# Patient Record
Sex: Female | Born: 1990 | Race: White | Hispanic: No | Marital: Married | Smoking: Never smoker
Health system: Southern US, Community
[De-identification: ages and names within clinical notes are randomized; demographics above are authoritative.]

## PROBLEM LIST (undated history)

## (undated) ENCOUNTER — Inpatient Hospital Stay (HOSPITAL_COMMUNITY): Payer: Self-pay

## (undated) ENCOUNTER — Inpatient Hospital Stay (HOSPITAL_COMMUNITY): Payer: 59

## (undated) DIAGNOSIS — G43909 Migraine, unspecified, not intractable, without status migrainosus: Secondary | ICD-10-CM

## (undated) HISTORY — PX: NO PAST SURGERIES: SHX2092

## (undated) HISTORY — PX: APPENDECTOMY: SHX54

---
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
## 2014-05-13 ENCOUNTER — Encounter (HOSPITAL_COMMUNITY): Payer: Self-pay | Admitting: Emergency Medicine

## 2014-05-13 ENCOUNTER — Emergency Department (HOSPITAL_COMMUNITY): Payer: Self-pay

## 2014-05-13 ENCOUNTER — Emergency Department (HOSPITAL_COMMUNITY)
Admission: EM | Admit: 2014-05-13 | Discharge: 2014-05-13 | Disposition: A | Discharge status: Home / Self Care | Payer: Self-pay | Attending: Emergency Medicine | Admitting: Emergency Medicine

## 2014-05-13 DIAGNOSIS — Z3202 Encounter for pregnancy test, result negative: Secondary | ICD-10-CM | POA: Insufficient documentation

## 2014-05-13 DIAGNOSIS — S0990XA Unspecified injury of head, initial encounter: Secondary | ICD-10-CM | POA: Insufficient documentation

## 2014-05-13 DIAGNOSIS — IMO0002 Reserved for concepts with insufficient information to code with codable children: Secondary | ICD-10-CM | POA: Insufficient documentation

## 2014-05-13 DIAGNOSIS — Y929 Unspecified place or not applicable: Secondary | ICD-10-CM | POA: Insufficient documentation

## 2014-05-13 DIAGNOSIS — Y9389 Activity, other specified: Secondary | ICD-10-CM | POA: Insufficient documentation

## 2014-05-13 DIAGNOSIS — F0781 Postconcussional syndrome: Secondary | ICD-10-CM | POA: Insufficient documentation

## 2014-05-13 LAB — CBC WITH DIFFERENTIAL/PLATELET
Basophils Absolute: 0 10*3/uL (ref 0.0–0.1)
Basophils Relative: 0 % (ref 0–1)
Eosinophils Absolute: 0.1 10*3/uL (ref 0.0–0.7)
Eosinophils Relative: 1 % (ref 0–5)
HCT: 42.4 % (ref 36.0–46.0)
Hemoglobin: 14.7 g/dL (ref 12.0–15.0)
Lymphocytes Relative: 23 % (ref 12–46)
Lymphs Abs: 1.7 10*3/uL (ref 0.7–4.0)
MCH: 32 pg (ref 26.0–34.0)
MCHC: 34.7 g/dL (ref 30.0–36.0)
MCV: 92.4 fL (ref 78.0–100.0)
Monocytes Absolute: 0.6 10*3/uL (ref 0.1–1.0)
Monocytes Relative: 8 % (ref 3–12)
Neutro Abs: 5 10*3/uL (ref 1.7–7.7)
Neutrophils Relative %: 68 % (ref 43–77)
Platelets: 218 10*3/uL (ref 150–400)
RBC: 4.59 MIL/uL (ref 3.87–5.11)
RDW: 13.3 % (ref 11.5–15.5)
WBC: 7.3 10*3/uL (ref 4.0–10.5)

## 2014-05-13 LAB — BASIC METABOLIC PANEL
Anion gap: 15 (ref 5–15)
BUN: 19 mg/dL (ref 6–23)
CO2: 24 mEq/L (ref 19–32)
Calcium: 8.9 mg/dL (ref 8.4–10.5)
Chloride: 99 mEq/L (ref 96–112)
Creatinine, Ser: 0.78 mg/dL (ref 0.50–1.10)
GFR calc Af Amer: >90 mL/min (ref >90)
GFR calc non Af Amer: >90 mL/min (ref >90)
Glucose, Bld: 98 mg/dL (ref 70–99)
Potassium: 4.2 mEq/L (ref 3.7–5.3)
Sodium: 138 mEq/L (ref 137–147)

## 2014-05-13 MED ORDER — DIPHENHYDRAMINE HCL 50 MG/ML IJ SOLN
12.5000 mg | Freq: Once | INTRAMUSCULAR | Status: AC
  Administered 2014-05-13: 12.5 mg via INTRAVENOUS
  Filled 2014-05-13: qty 1

## 2014-05-13 MED ORDER — HYDROCODONE-ACETAMINOPHEN 5-325 MG PO TABS: ORAL_TABLET | ORAL | Status: DC

## 2014-05-13 MED ORDER — METOCLOPRAMIDE HCL 5 MG/ML IJ SOLN
10.0000 mg | Freq: Once | INTRAMUSCULAR | Status: AC
  Administered 2014-05-13: 10 mg via INTRAVENOUS
  Filled 2014-05-13: qty 2

## 2014-05-13 MED ORDER — SODIUM CHLORIDE 0.9 % IV SOLN
Freq: Once | INTRAVENOUS | Status: AC
Start: 2014-05-13 — End: 2014-05-13
  Administered 2014-05-13: 19:00:00 via INTRAVENOUS
  Filled 2014-05-13: qty 1000

## 2014-05-13 MED ORDER — KETOROLAC TROMETHAMINE 30 MG/ML IJ SOLN
30.0000 mg | Freq: Once | INTRAMUSCULAR | Status: AC
  Administered 2014-05-13: 30 mg via INTRAVENOUS
  Filled 2014-05-13: qty 1

## 2014-05-13 NOTE — ED Provider Notes (Signed)
CSN: [id number]     Arrival date & time 05/13/14  1730 History   First MD Initiated Contact with Patient 05/13/14 1801     Chief Complaint  Patient presents with  . Migraine     (Consider location/radiation/quality/duration/timing/severity/associated sxs/prior Treatment) HPI   Rebecca Strickland is a 23 y.o. female who presents to the Emergency Department complaining of right sided headache for two days.  She states that she was playing with her dog who accidentally "head butted" her behind the right ear.  She denies LOC.  She reports headache since then, but several hours prior to ED arrival, she had a gradual  worsening pain, photophobia, dizziness, brief visional loss, and vomited x 1 episode.  She states that she took an OTC migraine medication and the symptoms improved although did not completely resolve.  She states that she occasionally has headaches, but not symptoms like she experienced earlier today.  She denies symptoms at present other than nausea and right sided headache.  She denies fever, recent illness, neck pain or stiffness.     History reviewed. No pertinent past medical history. History reviewed. No pertinent past surgical history. History reviewed. No pertinent family history. History  Substance Use Topics  . Smoking status: Not on file  . Smokeless tobacco: Not on file  . Alcohol Use: Not on file   OB History   Grav Para Term Preterm Abortions TAB SAB Ect Mult Living                 Review of Systems  Constitutional: Negative for fever, activity change and appetite change.  HENT: Negative for facial swelling and trouble swallowing.   Eyes: Positive for photophobia. Negative for pain and visual disturbance.  Respiratory: Negative for shortness of breath.   Cardiovascular: Negative for chest pain.  Gastrointestinal: Negative for nausea and vomiting.  Musculoskeletal: Negative for neck pain and neck stiffness.  Skin: Negative for rash and wound.   Neurological: Positive for headaches. Negative for dizziness, facial asymmetry, speech difficulty, weakness and numbness.  Psychiatric/Behavioral: Negative for confusion and decreased concentration.  All other systems reviewed and are negative.     Allergies  Review of patient's allergies indicates no known allergies.  Home Medications   Prior to Admission medications   Not on File   BP 135/75  Pulse 83  Temp(Src) 99.1 F (37.3 C) (Oral)  Resp 18  Ht  (1.6 m)  Wt 175 lb (79.379 kg)  BMI 31.01 kg/m2  SpO2 100%  LMP 04/23/2014 Physical Exam  Nursing note and vitals reviewed. Constitutional: She is oriented to person, place, and time. She appears well-developed and well-nourished. No distress.  HENT:  Head: Normocephalic and atraumatic.  Mouth/Throat: Oropharynx is clear and moist.  Eyes: EOM are normal. Pupils are equal, round, and reactive to light.  Neck: Normal range of motion and phonation normal. Neck supple. No spinous process tenderness and no muscular tenderness present. No rigidity. No Brudzinski's sign and no Kernig's sign noted.  Cardiovascular: Normal rate, regular rhythm, normal heart sounds and intact distal pulses.   No murmur heard. Pulmonary/Chest: Effort normal and breath sounds normal. No respiratory distress. She exhibits no tenderness.  Musculoskeletal: Normal range of motion.  Neurological: She is alert and oriented to person, place, and time. She has normal strength. No cranial nerve deficit or sensory deficit. She exhibits normal muscle tone. Coordination and gait normal. GCS eye subscore is 4. GCS verbal subscore is 5. GCS motor subscore is 6.  Reflex Scores:      Tricep reflexes are 2+ on the right side and 2+ on the left side.      Bicep reflexes are 2+ on the right side and 2+ on the left side.      Patellar reflexes are 2+ on the right side and 2+ on the left side.      Achilles reflexes are 2+ on the right side and 2+ on the left  side. Skin: Skin is warm and dry.  Psychiatric: She has a normal mood and affect.    ED Course  Procedures (including critical care time) Labs Review Labs Reviewed  CBC WITH DIFFERENTIAL  BASIC METABOLIC PANEL  POC URINE PREG, ED    Imaging Review Ct Head Wo Contrast  05/13/2014   CLINICAL DATA:  Headache since an injury 2 days ago.  EXAM: CT HEAD WITHOUT CONTRAST  TECHNIQUE: Contiguous axial images were obtained from the base of the skull through the vertex without intravenous contrast.  COMPARISON:  None.  FINDINGS: Normal appearing cerebral hemispheres and posterior fossa structures. Normal size and position of the ventricles. No skull fracture, intracranial hemorrhage or paranasal sinus air-fluid levels.  IMPRESSION: Normal examination.   Electronically Signed   By: Gordan Payment M.D.   On: 05/13/2014 19:33     EKG Interpretation None      POCT pregnancy was reported to me by the nursing staff as negative, could not get result to cross into EPIC  MDM   Final diagnoses:  Post concussion syndrome   Patient is well appearing, VSS.  No meningeal signs.  Clinical suspicion for intracranial injury is low, but given history, I will order labs and CT head.     1730  Patient is feeling better at this time. Headache has completely resolved. She is currently asymptomatic and wanting to go home.  I have advised her of possible post-concussive syndrome and to arrange PMD f/u or to return here if sx's worsen .  Pt verbalized understanding and agrees to plan.  She is ambulatory and stable for d/c.    Keagon Glascoe L. Verdie Barrows, PA-C 05/15/14 0045

## 2014-05-13 NOTE — ED Notes (Addendum)
Pt states headache starting Sunday that wouldn't go away and lost vision today at 1:30 and was light sensitive.  Pt vomited and was dizzy, vision was blurry. When vision came back, h/a was very severe for about 3-5 minutes. Pt denies slurred speech, but noted she stopped in the middle of a sentence.

## 2014-05-13 NOTE — Discharge Instructions (Signed)
Post-Concussion Syndrome °Post-concussion syndrome means you have problems after a head injury. The problems can last for weeks or months. The problems usually go away on their own over time. °HOME CARE  °· Only take medicines as told by your doctor. Do not take aspirin. °· Sleep with your head raised (elevated) to help with headaches. °· Avoid activities that can cause another head injury.  Do not play contact sports like football, hockey, soccer or basketball. Do not do other risky activities like downhill skiing or martial arts, or ride horses until your doctor says it is OK. °· Keep all doctor visits as told. °GET HELP RIGHT AWAY IF: °· You feel confused or very sleepy. °· You cannot wake the injured person. °· You feel sick to your stomach (nauseous) or keep throwing up (vomiting). °· You feel like you are moving when you are not (vertigo). °· You notice the injured person's eyes moving back and forth very fast. °· You start shaking (convulsing) or pass out (faint). °· You have very bad headaches that do not get better with medicine. °· You cannot use your arms or legs normally. °· The black centers of your eyes (pupils) change size. °· You have clear or bloody fluid coming from your nose or ears. °· Your problems get worse, not better. °MAKE SURE YOU: °· Understand these instructions. °· Will watch your condition. °· Will get help right away if you are not doing well or get worse. °Document Released: 10/13/2004 Document Revised: 06/26/2013 Document Reviewed: 12/11/2013 °Exit[employer]® Patient Information ©2015 [employer]. This information is not intended to replace advice given to you by your health care provider. Make sure you discuss any questions you have with your health care provider. ° °

## 2014-05-16 NOTE — ED Provider Notes (Signed)
Medical screening examination/treatment/procedure(s) were performed by non-physician practitioner and as supervising physician I was immediately available for consultation/collaboration.   EKG Interpretation None        Renald Haithcock L Avila Albritton, MD 05/16/14 1506 

## 2014-07-07 ENCOUNTER — Encounter (HOSPITAL_COMMUNITY): Payer: Self-pay | Admitting: Emergency Medicine

## 2014-07-07 ENCOUNTER — Emergency Department (INDEPENDENT_AMBULATORY_CARE_PROVIDER_SITE_OTHER)
Admission: EM | Admit: 2014-07-07 | Discharge: 2014-07-07 | Disposition: A | Discharge status: Home / Self Care | Payer: Self-pay | Source: Home / Self Care | Attending: Family Medicine | Admitting: Family Medicine

## 2014-07-07 DIAGNOSIS — J029 Acute pharyngitis, unspecified: Secondary | ICD-10-CM

## 2014-07-07 DIAGNOSIS — J069 Acute upper respiratory infection, unspecified: Secondary | ICD-10-CM

## 2014-07-07 HISTORY — DX: Migraine, unspecified, not intractable, without status migrainosus: G43.909

## 2014-07-07 LAB — POCT RAPID STREP A: Streptococcus, Group A Screen (Direct): NEGATIVE

## 2014-07-07 NOTE — Discharge Instructions (Signed)
Your rapid strep test was negative. Your specimen will be held for a 3 day culture and if results indicate the need for additional treatment you will be notified by phone. Please review info below for additional symptomatic care at home. Please use tylenol or ibuprofen as directed on packaging for pain.   Pharyngitis Pharyngitis is redness, pain, and swelling (inflammation) of your pharynx.  CAUSES  Pharyngitis is usually caused by infection. Most of the time, these infections are from viruses (viral) and are part of a cold. However, sometimes pharyngitis is caused by bacteria (bacterial). Pharyngitis can also be caused by allergies. Viral pharyngitis may be spread from person to person by coughing, sneezing, and personal items or utensils (cups, forks, spoons, toothbrushes). Bacterial pharyngitis may be spread from person to person by more intimate contact, such as kissing.  SIGNS AND SYMPTOMS  Symptoms of pharyngitis include:   Sore throat.   Tiredness (fatigue).   Low-grade fever.   Headache.  Joint pain and muscle aches.  Skin rashes.  Swollen lymph nodes.  Plaque-like film on throat or tonsils (often seen with bacterial pharyngitis). DIAGNOSIS  Your health care provider will ask you questions about your illness and your symptoms. Your medical history, along with a physical exam, is often all that is needed to diagnose pharyngitis. Sometimes, a rapid strep test is done. Other lab tests may also be done, depending on the suspected cause.  TREATMENT  Viral pharyngitis will usually get better in 3-4 days without the use of medicine. Bacterial pharyngitis is treated with medicines that kill germs (antibiotics).  HOME CARE INSTRUCTIONS   Drink enough water and fluids to keep your urine clear or pale yellow.   Only take over-the-counter or prescription medicines as directed by your health care provider:   If you are prescribed antibiotics, make sure you finish them even if you  start to feel better.   Do not take aspirin.   Get lots of rest.   Gargle with 8 oz of salt water ( tsp of salt per 1 qt of water) as often as every 1-2 hours to soothe your throat.   Throat lozenges (if you are not at risk for choking) or sprays may be used to soothe your throat. SEEK MEDICAL CARE IF:   You have large, tender lumps in your neck.  You have a rash.  You cough up green, yellow-brown, or bloody spit. SEEK IMMEDIATE MEDICAL CARE IF:   Your neck becomes stiff.  You drool or are unable to swallow liquids.  You vomit or are unable to keep medicines or liquids down.  You have severe pain that does not go away with the use of recommended medicines.  You have trouble breathing (not caused by a stuffy nose). MAKE SURE YOU:   Understand these instructions.  Will watch your condition.  Will get help right away if you are not doing well or get worse. Document Released: 09/05/2005 Document Revised: 06/26/2013 Document Reviewed: 05/13/2013 [facility] Patient Information 2015 [place], [place]. This information is not intended to replace advice given to you by your health care provider. Make sure you discuss any questions you have with your health care provider.  Strep Throat Tests While most sore throats are caused by viruses, at times they are caused by a bacteria called group A Streptococci (strep throat). It is important to determine the cause because the strep bacteria is treated with antibiotic medication. There are 2 types of tests for strep throat: a rapid strep test and  a throat culture. Both tests are done by wiping a swab over the back of the throat and then using chemicals to identify the type of bacteria present. The rapid strep test takes 10 to 20 minutes. If the rapid strep test is negative, a throat culture may be performed to confirm the results. With a throat culture, the swab is used to spread the bacteria on a gel plate and grow it in a lab, which may take  1 to 2 days. In some cases, the culture will detect strep bacteria not found with the rapid strep test. If the result of the rapid strep test is positive, no further testing is needed, and your caregiver will prescribe antibiotics. Not all test results are available during your visit. If your test results are not back during the visit, make an appointment with your caregiver to find out the results. Do not assume everything is normal if you have not heard from your caregiver or the medical facility. It is important for you to follow up on all of your test results. SEEK MEDICAL CARE IF:   Your symptoms are not improving within 1 to 2 days, or you are getting worse.  You have any other questions or concerns. SEEK IMMEDIATE MEDICAL CARE IF:   You have increased difficulty with swallowing.  You develop trouble breathing.  You have a fever. Document Released: 10/13/2004 Document Revised: 11/28/2011 Document Reviewed: 12/11/2013 [facility] Patient Information 2015 [place], [place]. This information is not intended to replace advice given to you by your health care provider. Make sure you discuss any questions you have with your health care provider.  Salt Water Gargle This solution will help make your mouth and throat feel better. HOME CARE INSTRUCTIONS   Mix 1 teaspoon of salt in 8 ounces of warm water.  Gargle with this solution as much or often as you need or as directed. Swish and gargle gently if you have any sores or wounds in your mouth.  Do not swallow this mixture. Document Released: 06/09/2004 Document Revised: 11/28/2011 Document Reviewed: 10/31/2008 [facility] Patient Information 2015 [place], [place]. This information is not intended to replace advice given to you by your health care provider. Make sure you discuss any questions you have with your health care provider.  Upper Respiratory Infection, Adult An upper respiratory infection (URI) is also sometimes known as the common cold.  The upper respiratory tract includes the nose, sinuses, throat, trachea, and bronchi. Bronchi are the airways leading to the lungs. Most people improve within 1 week, but symptoms can last up to 2 weeks. A residual cough may last even longer.  CAUSES Many different viruses can infect the tissues lining the upper respiratory tract. The tissues become irritated and inflamed and often become very moist. Mucus production is also common. A cold is contagious. You can easily spread the virus to others by oral contact. This includes kissing, sharing a glass, coughing, or sneezing. Touching your mouth or nose and then touching a surface, which is then touched by another person, can also spread the virus. SYMPTOMS  Symptoms typically develop 1 to 3 days after you come in contact with a cold virus. Symptoms vary from person to person. They may include:  Runny nose.  Sneezing.  Nasal congestion.  Sinus irritation.  Sore throat.  Loss of voice (laryngitis).  Cough.  Fatigue.  Muscle aches.  Loss of appetite.  Headache.  Low-grade fever. DIAGNOSIS  You might diagnose your own cold based on familiar symptoms, since  most people get a cold 2 to 3 times a year. Your caregiver can confirm this based on your exam. Most importantly, your caregiver can check that your symptoms are not due to another disease such as strep throat, sinusitis, pneumonia, asthma, or epiglottitis. Blood tests, throat tests, and X-rays are not necessary to diagnose a common cold, but they may sometimes be helpful in excluding other more serious diseases. Your caregiver will decide if any further tests are required. RISKS AND COMPLICATIONS  You may be at risk for a more severe case of the common cold if you smoke cigarettes, have chronic heart disease (such as heart failure) or lung disease (such as asthma), or if you have a weakened immune system. The very young and very old are also at risk for more serious infections.  Bacterial sinusitis, middle ear infections, and bacterial pneumonia can complicate the common cold. The common cold can worsen asthma and chronic obstructive pulmonary disease (COPD). Sometimes, these complications can require emergency medical care and may be life-threatening. PREVENTION  The best way to protect against getting a cold is to practice good hygiene. Avoid oral or hand contact with people with cold symptoms. Wash your hands often if contact occurs. There is no clear evidence that vitamin C, vitamin E, echinacea, or exercise reduces the chance of developing a cold. However, it is always recommended to get plenty of rest and practice good nutrition. TREATMENT  Treatment is directed at relieving symptoms. There is no cure. Antibiotics are not effective, because the infection is caused by a virus, not by bacteria. Treatment may include:  Increased fluid intake. Sports drinks offer valuable electrolytes, sugars, and fluids.  Breathing heated mist or steam (vaporizer or shower).  Eating chicken soup or other clear broths, and maintaining good nutrition.  Getting plenty of rest.  Using gargles or lozenges for comfort.  Controlling fevers with ibuprofen or acetaminophen as directed by your caregiver.  Increasing usage of your inhaler if you have asthma. Zinc gel and zinc lozenges, taken in the first 24 hours of the common cold, can shorten the duration and lessen the severity of symptoms. Pain medicines may help with fever, muscle aches, and throat pain. A variety of non-prescription medicines are available to treat congestion and runny nose. Your caregiver can make recommendations and may suggest nasal or lung inhalers for other symptoms.  HOME CARE INSTRUCTIONS   Only take over-the-counter or prescription medicines for pain, discomfort, or fever as directed by your caregiver.  Use a warm mist humidifier or inhale steam from a shower to increase air moisture. This may keep secretions  moist and make it easier to breathe.  Drink enough water and fluids to keep your urine clear or pale yellow.  Rest as needed.  Return to work when your temperature has returned to normal or as your caregiver advises. You may need to stay home longer to avoid infecting others. You can also use a face mask and careful hand washing to prevent spread of the virus. SEEK MEDICAL CARE IF:   After the first few days, you feel you are getting worse rather than better.  You need your caregiver's advice about medicines to control symptoms.  You develop chills, worsening shortness of breath, or brown or red sputum. These may be signs of pneumonia.  You develop yellow or brown nasal discharge or pain in the face, especially when you bend forward. These may be signs of sinusitis.  You develop a fever, swollen neck glands, pain with  swallowing, or white areas in the back of your throat. These may be signs of strep throat. SEEK IMMEDIATE MEDICAL CARE IF:   You have a fever.  You develop severe or persistent headache, ear pain, sinus pain, or chest pain.  You develop wheezing, a prolonged cough, cough up blood, or have a change in your usual mucus (if you have chronic lung disease).  You develop sore muscles or a stiff neck. Document Released: 03/01/2001 Document Revised: 11/28/2011 Document Reviewed: 12/11/2013 [facility] Patient Information 2015 [place], [place]. This information is not intended to replace advice given to you by your health care provider. Make sure you discuss any questions you have with your health care provider.  Sore Throat A sore throat is pain, burning, irritation, or scratchiness of the throat. There is often pain or tenderness when swallowing or talking. A sore throat may be accompanied by other symptoms, such as coughing, sneezing, fever, and swollen neck glands. A sore throat is often the first sign of another sickness, such as a cold, flu, strep throat, or mononucleosis  (commonly known as mono). Most sore throats go away without medical treatment. CAUSES  The most common causes of a sore throat include:  A viral infection, such as a cold, flu, or mono.  A bacterial infection, such as strep throat, tonsillitis, or whooping cough.  Seasonal allergies.  Dryness in the air.  Irritants, such as smoke or pollution.  Gastroesophageal reflux disease (GERD). HOME CARE INSTRUCTIONS   Only take over-the-counter medicines as directed by your caregiver.  Drink enough fluids to keep your urine clear or pale yellow.  Rest as needed.  Try using throat sprays, lozenges, or sucking on hard candy to ease any pain (if older than 4 years or as directed).  Sip warm liquids, such as broth, herbal tea, or warm water with honey to relieve pain temporarily. You may also eat or drink cold or frozen liquids such as frozen ice pops.  Gargle with salt water (mix 1 tsp salt with 8 oz of water).  Do not smoke and avoid secondhand smoke.  Put a cool-mist humidifier in your bedroom at night to moisten the air. You can also turn on a hot shower and sit in the bathroom with the door closed for 5-10 minutes. SEEK IMMEDIATE MEDICAL CARE IF:  You have difficulty breathing.  You are unable to swallow fluids, soft foods, or your saliva.  You have increased swelling in the throat.  Your sore throat does not get better in 7 days.  You have nausea and vomiting.  You have a fever or persistent symptoms for more than 2-3 days.  You have a fever and your symptoms suddenly get worse. MAKE SURE YOU:   Understand these instructions.  Will watch your condition.  Will get help right away if you are not doing well or get worse. Document Released: 10/13/2004 Document Revised: 08/22/2012 Document Reviewed: 05/13/2012 [facility] Patient Information 2015 [place], [place]. This information is not intended to replace advice given to you by your health care provider. Make sure you discuss  any questions you have with your health care provider.

## 2014-07-07 NOTE — ED Provider Notes (Signed)
CSN: [id number]     Arrival date & time 07/07/14  1118 History   First MD Initiated Contact with Patient 07/07/14 1147     Chief Complaint  Patient presents with  . Sore Throat   (Consider location/radiation/quality/duration/timing/severity/associated sxs/prior Treatment) HPI Comments: Reports associated nasal congestion, cough and post nasal drainage. 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 female presenting with pharyngitis. The history is provided by the patient.  Sore Throat This is a new problem. Episode onset: sx began 5 days ago. The problem has not changed since onset.   Past Medical History  Diagnosis Date  . Migraine    History reviewed. No pertinent past surgical history. No family history on file. History  Substance Use Topics  . Smoking status: Never Smoker   . Smokeless tobacco: Not on file  . Alcohol Use: Yes     Comment: occasional   OB History   Grav Para Term Preterm Abortions TAB SAB Ect Mult Living                 Review of Systems  All other systems reviewed and are negative.   Allergies  Review of patient's allergies indicates no known allergies.  Home Medications   Prior to Admission medications   Medication Sig Start Date End Date Taking? Authorizing Provider  aspirin-acetaminophen-caffeine (EXCEDRIN MIGRAINE) [phone] MG per tablet Take 2 tablets by mouth daily as needed for headache or migraine.   Yes Historical Provider, MD  ibuprofen (ADVIL,MOTRIN) 200 MG tablet Take 400 mg by mouth daily as needed for headache.   Yes Historical Provider, MD  HYDROcodone-acetaminophen (NORCO/VICODIN) 5-325 MG per tablet Take one-two tabs po q 4-6 hrs prn pain 05/13/14   Tammy L. Triplett, PA-C   BP 120/80  Temp(Src) 97.8 F (36.6 C) (Oral)  Resp 16  SpO2 99%  LMP 07/07/2014 Physical Exam  Nursing note and vitals reviewed. Constitutional: She is oriented to person, place, and time.  HENT:  Head: Normocephalic  and atraumatic.  Right Ear: Hearing, tympanic membrane, external ear and ear canal normal.  Left Ear: Hearing, tympanic membrane, external ear and ear canal normal.  Nose: Mucosal edema present.  Mouth/Throat: Uvula is midline, oropharynx is clear and moist and mucous membranes are normal. No oral lesions. No trismus in the jaw.  Eyes: Conjunctivae are normal. No scleral icterus.  Neck: Normal range of motion. Neck supple.  Cardiovascular: Normal rate, regular rhythm and normal heart sounds.   Pulmonary/Chest: Effort normal and breath sounds normal.  Musculoskeletal: Normal range of motion.  Lymphadenopathy:    She has no cervical adenopathy.  Neurological: She is alert and oriented to person, place, and time.  Skin: Skin is warm and dry.  Psychiatric: She has a normal mood and affect. Her behavior is normal.    ED Course  Procedures (including critical care time) Labs Review Labs Reviewed  POCT RAPID STREP A ([facility] CARE ONLY)    Imaging Review No results found.   MDM   1. Pharyngitis   2. URI (upper respiratory infection)   Your rapid strep test was negative. Your specimen will be held for a 3 day culture and if results indicate the need for additional treatment you will be notified by phone. Please review info below for additional symptomatic care at home. Please use tylenol or ibuprofen as directed on packaging for pain.    Ria ClockJennifer Lee H Deloss Amico, GeorgiaPA 07/07/14 1246

## 2014-07-07 NOTE — ED Notes (Signed)
Sore throat x 5 days, pt had a low grade temp 1 day, now a productive cough.

## 2014-07-09 LAB — CULTURE, GROUP A STREP

## 2014-07-09 NOTE — ED Provider Notes (Signed)
Medical screening examination/treatment/procedure(s) were performed by a resident physician or non-physician practitioner and as the supervising physician I was immediately available for consultation/collaboration.  Clementeen GrahamEvan Corey, MD    Rodolph BongEvan S Corey, MD 07/09/14 [phone]

## 2014-08-15 ENCOUNTER — Inpatient Hospital Stay (HOSPITAL_COMMUNITY): Payer: Medicaid Other

## 2014-08-15 ENCOUNTER — Inpatient Hospital Stay (HOSPITAL_COMMUNITY)
Admission: AD | Admit: 2014-08-15 | Discharge: 2014-08-15 | Disposition: A | Discharge status: Home / Self Care | Payer: Medicaid Other | Source: Ambulatory Visit | Attending: Obstetrics & Gynecology | Admitting: Obstetrics & Gynecology

## 2014-08-15 ENCOUNTER — Encounter (HOSPITAL_COMMUNITY): Payer: Self-pay | Admitting: *Deleted

## 2014-08-15 DIAGNOSIS — R58 Hemorrhage, not elsewhere classified: Secondary | ICD-10-CM

## 2014-08-15 DIAGNOSIS — O468X1 Other antepartum hemorrhage, first trimester: Secondary | ICD-10-CM

## 2014-08-15 DIAGNOSIS — O209 Hemorrhage in early pregnancy, unspecified: Secondary | ICD-10-CM | POA: Insufficient documentation

## 2014-08-15 DIAGNOSIS — O418X10 Other specified disorders of amniotic fluid and membranes, first trimester, not applicable or unspecified: Secondary | ICD-10-CM

## 2014-08-15 DIAGNOSIS — Z3A01 Less than 8 weeks gestation of pregnancy: Secondary | ICD-10-CM | POA: Insufficient documentation

## 2014-08-15 LAB — URINALYSIS, ROUTINE W REFLEX MICROSCOPIC
Bilirubin Urine: NEGATIVE
Glucose, UA: NEGATIVE mg/dL
Ketones, ur: NEGATIVE mg/dL
Leukocytes, UA: NEGATIVE
NITRITE: NEGATIVE
PROTEIN: NEGATIVE mg/dL
Specific Gravity, Urine: 1.01 (ref 1.005–1.030)
UROBILINOGEN UA: 0.2 mg/dL (ref 0.0–1.0)
pH: 6.5 (ref 5.0–8.0)

## 2014-08-15 LAB — CBC
HEMATOCRIT: 40.5 % (ref 36.0–46.0)
Hemoglobin: 14.2 g/dL (ref 12.0–15.0)
MCH: 32.4 pg (ref 26.0–34.0)
MCHC: 35.1 g/dL (ref 30.0–36.0)
MCV: 92.5 fL (ref 78.0–100.0)
Platelets: 205 10*3/uL (ref 150–400)
RBC: 4.38 MIL/uL (ref 3.87–5.11)
RDW: 13 % (ref 11.5–15.5)
WBC: 7.5 10*3/uL (ref 4.0–10.5)

## 2014-08-15 LAB — HCG, QUANTITATIVE, PREGNANCY: HCG, BETA CHAIN, QUANT, S: 8014 m[IU]/mL — AB (ref <5)

## 2014-08-15 LAB — WET PREP, GENITAL
TRICH WET PREP: NONE SEEN
Yeast Wet Prep HPF POC: NONE SEEN

## 2014-08-15 LAB — URINE MICROSCOPIC-ADD ON

## 2014-08-15 LAB — ABO/RH: ABO/RH(D): O POS

## 2014-08-15 LAB — POCT PREGNANCY, URINE: PREG TEST UR: POSITIVE — AB

## 2014-08-15 NOTE — MAU Note (Signed)
Spotting on Wed, brown.  Last night had some cramping, started again today.  Then at 1500 started having some red bleeding.

## 2014-08-15 NOTE — Discharge Instructions (Signed)
Subchorionic Hematoma °A subchorionic hematoma is a gathering of blood between the outer wall of the placenta and the inner wall of the womb (uterus). The placenta is the organ that connects the fetus to the wall of the uterus. The placenta performs the feeding, breathing (oxygen to the fetus), and waste removal (excretory work) of the fetus.  °Subchorionic hematoma is the most common abnormality found on a result from ultrasonography done during the first trimester or early second trimester of pregnancy. If there has been little or no vaginal bleeding, early small hematomas usually shrink on their own and do not affect your baby or pregnancy. The blood is gradually absorbed over 1-2 weeks. When bleeding starts later in pregnancy or the hematoma is larger or occurs in an older pregnant woman, the outcome may not be as good. Larger hematomas may get bigger, which increases the chances for miscarriage. Subchorionic hematoma also increases the risk of premature detachment of the placenta from the uterus, preterm (premature) labor, and stillbirth. °HOME CARE INSTRUCTIONS °· Stay on bed rest if your health care provider recommends this. Although bed rest will not prevent more bleeding or prevent a miscarriage, your health care provider may recommend bed rest until you are advised otherwise. °· Avoid heavy lifting (more than 10 lb [4.5 kg]), exercise, sexual intercourse, or douching as directed by your health care provider. °· Keep track of the number of pads you use each day and how soaked (saturated) they are. Write down this information. °· Do not use tampons. °· Keep all follow-up appointments as directed by your health care provider. Your health care provider may ask you to have follow-up blood tests or ultrasound tests or both. °SEEK IMMEDIATE MEDICAL CARE IF: °· You have severe cramps in your stomach, back, abdomen, or pelvis. °· You have a fever. °· You pass large clots or tissue. Save any tissue for your health  care provider to look at. °· Your bleeding increases or you become lightheaded, feel weak, or have fainting episodes. °Document Released: 12/21/2006 Document Revised: 01/20/2014 Document Reviewed: 04/04/2013 °ExitCare® Patient Information ©2015 [employer]. This information is not intended to replace advice given to you by your health care provider. Make sure you discuss any questions you have with your health care provider. °First Trimester of Pregnancy °The first trimester of pregnancy is from week 1 until the end of week 12 (months 1 through 3). During this time, your baby will begin to develop inside you. At 6-8 weeks, the eyes and face are formed, and the heartbeat can be seen on ultrasound. At the end of 12 weeks, all the baby's organs are formed. Prenatal care is all the medical care you receive before the birth of your baby. Make sure you get good prenatal care and follow all of your doctor's instructions. °HOME CARE  °Medicines °· Take medicine only as told by your doctor. Some medicines are safe and some are not during pregnancy. °· Take your prenatal vitamins as told by your doctor. °· Take medicine that helps you poop (stool softener) as needed if your doctor says it is okay. °Diet °· Eat regular, healthy meals. °· Your doctor will tell you the amount of weight gain that is right for you. °· Avoid raw meat and uncooked cheese. °· If you feel sick to your stomach (nauseous) or throw up (vomit): °· Eat 4 or 5 small meals a day instead of 3 large meals. °· Try eating a few soda crackers. °· Drink liquids between meals   instead of during meals. °· If you have a hard time pooping (constipation): °· Eat high-fiber foods like fresh vegetables, fruit, and whole grains. °· Drink enough fluids to keep your pee (urine) clear or pale yellow. °Activity and Exercise °· Exercise only as told by your doctor. Stop exercising if you have cramps or pain in your lower belly (abdomen) or low back. °· Try to avoid standing  for long periods of time. Move your legs often if you must stand in one place for a long time. °· Avoid heavy lifting. °· Wear low-heeled shoes. Sit and stand up straight. °· You can have sex unless your doctor tells you not to. °Relief of Pain or Discomfort °· Wear a good support bra if your breasts are sore. °· Take warm water baths (sitz baths) to soothe pain or discomfort caused by hemorrhoids. Use hemorrhoid cream if your doctor says it is okay. °· Rest with your legs raised if you have leg cramps or low back pain. °· Wear support hose if you have puffy, bulging veins (varicose veins) in your legs. Raise (elevate) your feet for 15 minutes, 3-4 times a day. Limit salt in your diet. °Prenatal Care °· Schedule your prenatal visits by the twelfth week of pregnancy. °· Write down your questions. Take them to your prenatal visits. °· Keep all your prenatal visits as told by your doctor. °Safety °· Wear your seat belt at all times when driving. °· Make a list of emergency phone numbers. The list should include numbers for family, friends, the hospital, and police and fire departments. °General Tips °· Ask your doctor for a referral to a local prenatal class. Begin classes no later than at the start of month 6 of your pregnancy. °· Ask for help if you need counseling or help with nutrition. Your doctor can give you advice or tell you where to go for help. °· Do not use hot tubs, steam rooms, or saunas. °· Do not douche or use tampons or scented sanitary pads. °· Do not cross your legs for long periods of time. °· Avoid litter boxes and soil used by cats. °· Avoid all smoking, herbs, and alcohol. Avoid drugs not approved by your doctor. °· Visit your dentist. At home, brush your teeth with a soft toothbrush. Be gentle when you floss. °GET HELP IF: °· You are dizzy. °· You have mild cramps or pressure in your lower belly. °· You have a nagging pain in your belly area. °· You continue to feel sick to your stomach, throw  up, or have watery poop (diarrhea). °· You have a bad smelling fluid coming from your vagina. °· You have pain with peeing (urination). °· You have increased puffiness (swelling) in your face, hands, legs, or ankles. °GET HELP RIGHT AWAY IF:  °· You have a fever. °· You are leaking fluid from your vagina. °· You have spotting or bleeding from your vagina. °· You have very bad belly cramping or pain. °· You gain or lose weight rapidly. °· You throw up blood. It may look like coffee grounds. °· You are around people who have German measles, fifth disease, or chickenpox. °· You have a very bad headache. °· You have shortness of breath. °· You have any kind of trauma, such as from a fall or a car accident. °Document Released: 02/22/2008 Document Revised: 01/20/2014 Document Reviewed: 07/16/2013 °ExitCare® Patient Information ©2015 [employer]. This information is not intended to replace advice given to you by your health   care provider. Make sure you discuss any questions you have with your health care provider. ° °

## 2014-08-15 NOTE — MAU Provider Note (Signed)
History     CSN: [id number]  Arrival date and time: 08/15/14 4098   First Provider Initiated Contact with Patient 08/15/14 1958      Chief Complaint  Patient presents with  . Possible Pregnancy  . Vaginal Bleeding  . Abdominal Pain   HPI Rebecca Strickland 23 y.o. G1P0 [redacted]w[redacted]d presents to MAU complaining of abdominal pain and vaginal bleeding in pregnancy that began at noon today.  She has noticed red blood upon wiping after using bathroom.  The cramping is 3-4/10 and did go away for about an hour earlier this afternoon but has started back again.  She has had some nausea and heartburn.  She denies vomiting, weakness, dizziness, fever, chills, sweats.  Last IC a month ago. OB History    Gravida Para Term Preterm AB TAB SAB Ectopic Multiple Living   1               Past Medical History  Diagnosis Date  . Migraine     Past Surgical History  Procedure Laterality Date  . No past surgeries      No family history on file.  History  Substance Use Topics  . Smoking status: Never Smoker   . Smokeless tobacco: Not on file  . Alcohol Use: Yes     Comment: occasional    Allergies:  Allergies  Allergen Reactions  . Shellfish Allergy Hives and Nausea And Vomiting    No prescriptions prior to admission    ROS Pertinent ROS in HPI Physical Exam   Blood pressure 118/68, pulse 88, temperature 99.1 F (37.3 C), temperature source Oral, resp. rate 18, height 5\' 3"  (1.6 m), weight 181 lb (82.101 kg), last menstrual period 07/07/2014.  Physical Exam  Constitutional: She is oriented to person, place, and time. She appears well-developed and well-nourished.  HENT:  Head: Normocephalic and atraumatic.  Eyes: EOM are normal.  Neck: Normal range of motion.  Cardiovascular: Normal rate and regular rhythm.   Respiratory: Effort normal and breath sounds normal. No respiratory distress.  GI: Soft. Bowel sounds are normal. She exhibits no distension. There is no tenderness. There is no  rebound and no guarding.  Genitourinary: Uterus is enlarged (slightly). Uterus is not tender. Cervix exhibits no motion tenderness, no discharge and no friability. There is bleeding (scant light red blood noted) in the vagina. No vaginal discharge found.  Musculoskeletal: Normal range of motion.  Neurological: She is alert and oriented to person, place, and time.  Skin: Skin is warm and dry.  Psychiatric: She has a normal mood and affect.   Results for orders placed or performed during the hospital encounter of 08/15/14 (from the past 24 hour(s))  Urinalysis, Routine w reflex microscopic     Status: Abnormal   Collection Time: 08/15/14  6:05 PM  Result Value Ref Range   Color, Urine YELLOW YELLOW   APPearance CLEAR CLEAR   Specific Gravity, Urine 1.010 1.005 - 1.030   pH 6.5 5.0 - 8.0   Glucose, UA NEGATIVE NEGATIVE mg/dL   Hgb urine dipstick MODERATE (A) NEGATIVE   Bilirubin Urine NEGATIVE NEGATIVE   Ketones, ur NEGATIVE NEGATIVE mg/dL   Protein, ur NEGATIVE NEGATIVE mg/dL   Urobilinogen, UA 0.2 0.0 - 1.0 mg/dL   Nitrite NEGATIVE NEGATIVE   Leukocytes, UA NEGATIVE NEGATIVE  Urine microscopic-add on     Status: None   Collection Time: 08/15/14  6:05 PM  Result Value Ref Range   Squamous Epithelial / LPF RARE RARE  WBC, UA 0-2 <3 WBC/hpf  Pregnancy, urine POC     Status: Abnormal   Collection Time: 08/15/14  6:32 PM  Result Value Ref Range   Preg Test, Ur POSITIVE (A) NEGATIVE  CBC     Status: None   Collection Time: 08/15/14  6:43 PM  Result Value Ref Range   WBC 7.5 4.0 - 10.5 K/uL   RBC 4.38 3.87 - 5.11 MIL/uL   Hemoglobin 14.2 12.0 - 15.0 g/dL   HCT 27.240.5 53.636.0 - 64.446.0 %   MCV 92.5 78.0 - 100.0 fL   MCH 32.4 26.0 - 34.0 pg   MCHC 35.1 30.0 - 36.0 g/dL   RDW 03.413.0 74.211.5 - 59.515.5 %   Platelets 205 150 - 400 K/uL  hCG, quantitative, pregnancy     Status: Abnormal   Collection Time: 08/15/14  6:43 PM  Result Value Ref Range   hCG, Beta Chain, Quant, S 8014 (H) <5 mIU/mL   ABO/Rh     Status: None   Collection Time: 08/15/14  6:43 PM  Result Value Ref Range   ABO/RH(D) O POS   Wet prep, genital     Status: Abnormal   Collection Time: 08/15/14  9:40 PM  Result Value Ref Range   Yeast Wet Prep HPF POC NONE SEEN NONE SEEN   Trich, Wet Prep NONE SEEN NONE SEEN   Clue Cells Wet Prep HPF POC FEW (A) NONE SEEN   WBC, Wet Prep HPF POC MANY (A) NONE SEEN   [place] Ob Comp Less 14 Wks  08/15/2014   CLINICAL DATA:  Vaginal bleeding, cramping.  EXAM: OBSTETRIC <14 WK US AND TRANSVAGINAL OB US  TECHNIQUE: Both transabdominal and transvaginal ultrasound examinations were performed for complete evaluation of the gestation as well as the maternal uterus, adnexal regions, and pelvic cul-de-sac. Transvaginal technique was performed to assess early pregnancy.  COMPARISON:  None.  FINDINGS: Intrauterine gestational sac: Visualized/normal in shape.  Yolk sac:  Visualized.  Embryo:  Not visualized.  Cardiac Activity: Not visualized.  MSD:  7.9   mm   5 w   3  d  US EDC: April 13, 2014.  Maternal uterus/adnexae: Both ovaries appear normal. Probable subchorionic hemorrhage is noted. Trace free fluid is noted.  IMPRESSION: Probable early intrauterine gestational sac with yolk sac, but no fetal pole, or cardiac activity yet visualized. Probable subchorionic hemorrhage noted as well. Recommend follow-up quantitative B-HCG levels and follow-up US in 14 days to confirm and assess viability. This recommendation follows SRU consensus guidelines: Diagnostic Criteria for Nonviable Pregnancy Early in the First Trimester. Malva Limes Engl J Med 2013; 638:7564-33; 369:1443-51.   Electronically Signed   By: Roque LiasJames  Green M.D.   On: 08/15/2014 21:04   [place] Ob Transvaginal  08/15/2014   CLINICAL DATA:  Vaginal bleeding, cramping.  EXAM: OBSTETRIC <14 WK US AND TRANSVAGINAL OB US  TECHNIQUE: Both transabdominal and transvaginal ultrasound examinations were performed for complete evaluation of the gestation as well as the maternal  uterus, adnexal regions, and pelvic cul-de-sac. Transvaginal technique was performed to assess early pregnancy.  COMPARISON:  None.  FINDINGS: Intrauterine gestational sac: Visualized/normal in shape.  Yolk sac:  Visualized.  Embryo:  Not visualized.  Cardiac Activity: Not visualized.  MSD:  7.9   mm   5 w   3  d  US EDC: April 13, 2014.  Maternal uterus/adnexae: Both ovaries appear normal. Probable subchorionic hemorrhage is noted. Trace free fluid is noted.  IMPRESSION: Probable early intrauterine gestational sac with  yolk sac, but no fetal pole, or cardiac activity yet visualized. Probable subchorionic hemorrhage noted as well. Recommend follow-up quantitative B-HCG levels and follow-up US in 14 days to confirm and assess viability. This recommendation follows SRU consensus guidelines: Diagnostic Criteria for Nonviable Pregnancy Early in the First Trimester. Malva Limes Engl J Med 2013; 409:8119-14; 369:1443-51.   Electronically Signed   By: Roque LiasJames  Green M.D.   On: 08/15/2014 21:04    MAU Course  Procedures  MDM Initial orders of bloodwork and U/S are pending.  Pelvic exam yet to be done.  Report given and care turned over to [facility] Center[facility]ie Destony Prevost, PA-C.   Bertram Denvereague Clark, Karen E 08/15/2014, 8:01 PM  Assessment and Plan  A:   IUGS and YS at 1122w3d Subchorionic hemorrhage  P:  Discharge to home Bleeding precautions discussed Pregnancy confirmation letter given [facility] Patient may return to MAU as needed or if her condition were to change or worsen  Marny LowensteinJulie N Buryl Bamber, PA-C 08/15/2014 10:42 PM

## 2014-08-18 LAB — GC/CHLAMYDIA PROBE AMP
CT PROBE, AMP APTIMA: NEGATIVE
GC PROBE AMP APTIMA: NEGATIVE

## 2014-08-20 ENCOUNTER — Inpatient Hospital Stay (HOSPITAL_COMMUNITY): Payer: Medicaid Other

## 2014-08-20 ENCOUNTER — Inpatient Hospital Stay (HOSPITAL_COMMUNITY)
Admission: AD | Admit: 2014-08-20 | Discharge: 2014-08-20 | Disposition: A | Discharge status: Home / Self Care | Payer: Medicaid Other | Source: Ambulatory Visit | Attending: Obstetrics & Gynecology | Admitting: Obstetrics & Gynecology

## 2014-08-20 ENCOUNTER — Encounter (HOSPITAL_COMMUNITY): Payer: Self-pay | Admitting: *Deleted

## 2014-08-20 DIAGNOSIS — O209 Hemorrhage in early pregnancy, unspecified: Secondary | ICD-10-CM

## 2014-08-20 DIAGNOSIS — O368910 Maternal care for other specified fetal problems, first trimester, not applicable or unspecified: Secondary | ICD-10-CM | POA: Diagnosis not present

## 2014-08-20 DIAGNOSIS — Z3A01 Less than 8 weeks gestation of pregnancy: Secondary | ICD-10-CM | POA: Insufficient documentation

## 2014-08-20 DIAGNOSIS — O43891 Other placental disorders, first trimester: Secondary | ICD-10-CM

## 2014-08-20 LAB — URINALYSIS, ROUTINE W REFLEX MICROSCOPIC
BILIRUBIN URINE: NEGATIVE
Glucose, UA: NEGATIVE mg/dL
KETONES UR: NEGATIVE mg/dL
Leukocytes, UA: NEGATIVE
NITRITE: NEGATIVE
Protein, ur: NEGATIVE mg/dL
SPECIFIC GRAVITY, URINE: 1.015 (ref 1.005–1.030)
UROBILINOGEN UA: 0.2 mg/dL (ref 0.0–1.0)
pH: 6 (ref 5.0–8.0)

## 2014-08-20 LAB — URINE MICROSCOPIC-ADD ON

## 2014-08-20 NOTE — Progress Notes (Signed)
Pt has appt with [facility] in January, has not been to the office so far.  CNM states MAU provider will need to see pt.

## 2014-08-20 NOTE — MAU Note (Signed)
Pt dx'd with subchorionic hemorrhage last week, was having rust colored discharge.  Today got very upset @ work, began cramping, noted bright red bleeding also, states there was a lot of blood in the toilet.  Happened @ approx 1100.

## 2014-08-20 NOTE — MAU Provider Note (Signed)
History     CSN: [id number]  Arrival date and time: 08/20/14 1245   First Provider Initiated Contact with Patient 08/20/14 1400      Chief Complaint  Patient presents with  . Vaginal Bleeding  . Abdominal Pain   HPI   Ms. Rebecca Strickland is a 23 y.o. female G1P0 [id number] who presents to MAU with worsening of vaginal bleeding. She states that the bleeding appears to be menstrual like. She is also experiencing some abdominal cramping. Currently while laying in the bed the cramps have subsided. She was here last Friday; diagnosed with subchorionic hemorrhage and was told to come back if bleeding worsened. Currently rates her pain 0/10.   [facility]   OB History    Gravida Para Term Preterm AB TAB SAB Ectopic Multiple Living   1               Past Medical History  Diagnosis Date  . Migraine     Past Surgical History  Procedure Laterality Date  . No past surgeries      History reviewed. No pertinent family history.  History  Substance Use Topics  . Smoking status: Never Smoker   . Smokeless tobacco: Not on file  . Alcohol Use: Yes     Comment: occasional    Allergies:  Allergies  Allergen Reactions  . Shellfish Allergy Hives and Nausea And Vomiting    Prescriptions prior to admission  Medication Sig Dispense Refill Last Dose  . acetaminophen (TYLENOL) 325 MG tablet Take 650 mg by mouth every 6 (six) hours as needed for headache.   08/20/2014 at Unknown time  . calcium carbonate (TUMS - DOSED IN MG ELEMENTAL CALCIUM) 500 MG chewable tablet Chew 1 tablet by mouth daily as needed for indigestion or heartburn.   Past Week at Unknown time  . Prenatal Vit-Fe Fumarate-FA (PRENATAL MULTIVITAMIN) TABS tablet Take 1 tablet by mouth daily at 12 noon.   08/19/2014 at Unknown time  . HYDROcodone-acetaminophen (NORCO/VICODIN) 5-325 MG per tablet Take one-two tabs po q 4-6 hrs prn pain (Patient not taking: Reported on 08/20/2014) 10 tablet 0 prn    Results for orders placed or performed during the hospital encounter of 08/20/14 (from the past 48 hour(s))  Urinalysis, Routine w reflex microscopic     Status: Abnormal   Collection Time: 08/20/14  1:07 PM  Result Value Ref Range   Color, Urine YELLOW YELLOW   APPearance CLEAR CLEAR   Specific Gravity, Urine 1.015 1.005 - 1.030   pH 6.0 5.0 - 8.0   Glucose, UA NEGATIVE NEGATIVE mg/dL   Hgb urine dipstick MODERATE (A) NEGATIVE   Bilirubin Urine NEGATIVE NEGATIVE   Ketones, ur NEGATIVE NEGATIVE mg/dL   Protein, ur NEGATIVE NEGATIVE mg/dL   Urobilinogen, UA 0.2 0.0 - 1.0 mg/dL   Nitrite NEGATIVE NEGATIVE   Leukocytes, UA NEGATIVE NEGATIVE  Urine microscopic-add on     Status: None   Collection Time: 08/20/14  1:07 PM  Result Value Ref Range   Squamous Epithelial / LPF RARE RARE   WBC, UA 0-2 <3 WBC/hpf   RBC / HPF 3-6 <3 RBC/hpf   Bacteria, UA RARE RARE   [place] Ob Transvaginal  08/20/2014   CLINICAL DATA:  Pregnant, vaginal bleeding  EXAM: TRANSVAGINAL OB ULTRASOUND  TECHNIQUE: Transvaginal ultrasound was performed for complete evaluation of the gestation as well as the maternal uterus, adnexal regions, and pelvic cul-de-sac.  COMPARISON:  08/15/2014  FINDINGS: Intrauterine gestational sac: Visualized/normal in shape.  Yolk sac:  Present  Embryo:  Present  Cardiac Activity: Present  Heart Rate: 102 bpm  CRL:   2.6  mm   5 w 6 d                  US EDC: 04/16/2015  Maternal uterus/adnexae: Moderate subchronic hemorrhage.  Left ovary is within normal limits, measuring 2.6 x 2.1 x 2.3 cm, noting a simple corpus luteal cyst.  Right ovary is within normal limits, measuring 2.4 x 1.7 x 1.9 cm.  No free fluid.  IMPRESSION: Single live intrauterine gestation, measuring 5 weeks 6 days by crown-rump length.   Electronically Signed   By: Charline BillsSriyesh  Krishnan M.D.   On: 08/20/2014 15:28    Review of Systems  Constitutional: Negative for fever and chills.  Gastrointestinal: Positive for nausea.  Negative for vomiting and abdominal pain (Earlier today she had cramping ).   Physical Exam   Blood pressure 127/67, pulse 99, temperature 98.1 F (36.7 C), temperature source Oral, resp. rate 20, last menstrual period 07/07/2014.  Physical Exam  Constitutional: She is oriented to person, place, and time. She appears well-developed and well-nourished. No distress.  HENT:  Head: Normocephalic.  Neck: Neck supple.  GI: Soft. She exhibits no distension. There is no tenderness.  Genitourinary:  Speculum exam: Vagina - Small amount of dark red blood in vaginal canal  Cervix -+ bleeding from cervix; small amount, mucus like.  Bimanual exam: Cervix closed Uterus non tender, enlarged Adnexa non tender, no masses bilaterally Chaperone present for exam.   Musculoskeletal: Normal range of motion.  Neurological: She is alert and oriented to person, place, and time.  Skin: Skin is warm. She is not diaphoretic.  Psychiatric: Her behavior is normal.    MAU Course  Procedures  None  MDM O positive blood type US    Assessment and Plan   A: 1. Subchorionic hematoma, first trimester   2. Vaginal bleeding before [redacted] weeks gestation    P: Discharge home in stable condition Bleeding precautions discussed Return to MAU if bleeding worsens  Bleeding precautions Work note provided per patient requests  Start prenatal care ASAP  Iona HansenJennifer Irene Rasch, NP 08/20/2014 4:04 PM

## 2014-08-20 NOTE — Discharge Instructions (Signed)
Subchorionic Hematoma °A subchorionic hematoma is a gathering of blood between the outer wall of the placenta and the inner wall of the womb (uterus). The placenta is the organ that connects the fetus to the wall of the uterus. The placenta performs the feeding, breathing (oxygen to the fetus), and waste removal (excretory work) of the fetus.  °Subchorionic hematoma is the most common abnormality found on a result from ultrasonography done during the first trimester or early second trimester of pregnancy. If there has been little or no vaginal bleeding, early small hematomas usually shrink on their own and do not affect your baby or pregnancy. The blood is gradually absorbed over 1-2 weeks. When bleeding starts later in pregnancy or the hematoma is larger or occurs in an older pregnant woman, the outcome may not be as good. Larger hematomas may get bigger, which increases the chances for miscarriage. Subchorionic hematoma also increases the risk of premature detachment of the placenta from the uterus, preterm (premature) labor, and stillbirth. °HOME CARE INSTRUCTIONS °· Stay on bed rest if your health care provider recommends this. Although bed rest will not prevent more bleeding or prevent a miscarriage, your health care provider may recommend bed rest until you are advised otherwise. °· Avoid heavy lifting (more than 10 lb [4.5 kg]), exercise, sexual intercourse, or douching as directed by your health care provider. °· Keep track of the number of pads you use each day and how soaked (saturated) they are. Write down this information. °· Do not use tampons. °· Keep all follow-up appointments as directed by your health care provider. Your health care provider may ask you to have follow-up blood tests or ultrasound tests or both. °SEEK IMMEDIATE MEDICAL CARE IF: °· You have severe cramps in your stomach, back, abdomen, or pelvis. °· You have a fever. °· You pass large clots or tissue. Save any tissue for your health  care provider to look at. °· Your bleeding increases or you become lightheaded, feel weak, or have fainting episodes. °Document Released: 12/21/2006 Document Revised: 01/20/2014 Document Reviewed: 04/04/2013 °ExitCare® Patient Information ©2015 [employer]. This information is not intended to replace advice given to you by your health care provider. Make sure you discuss any questions you have with your health care provider. ° °Pelvic Rest °Pelvic rest is sometimes recommended for women when:  °· The placenta is partially or completely covering the opening of the cervix (placenta previa). °· There is bleeding between the uterine wall and the amniotic sac in the first trimester (subchorionic hemorrhage). °· The cervix begins to open without labor starting (incompetent cervix, cervical insufficiency). °· The labor is too early (preterm labor). °HOME CARE INSTRUCTIONS °· Do not have sexual intercourse, stimulation, or an orgasm. °· Do not use tampons, douche, or put anything in the vagina. °· Do not lift anything over 10 pounds (4.5 kg). °· Avoid strenuous activity or straining your pelvic muscles. °SEEK MEDICAL CARE IF:  °· You have any vaginal bleeding during pregnancy. Treat this as a potential emergency. °· You have cramping pain felt low in the stomach (stronger than menstrual cramps). °· You notice vaginal discharge (watery, mucus, or bloody). °· You have a low, dull backache. °· There are regular contractions or uterine tightening. °SEEK IMMEDIATE MEDICAL CARE IF: °You have vaginal bleeding and have placenta previa.  °Document Released: 12/31/2010 Document Revised: 11/28/2011 Document Reviewed: 12/31/2010 °ExitCare® Patient Information ©2015 [employer]. This information is not intended to replace advice given to you by your health care provider.   Make sure you discuss any questions you have with your health care provider. ° °

## 2014-09-19 NOTE — L&D Delivery Note (Signed)
Operative Delivery Note At 1:41 PM a viable and healthy female was delivered via Vaginal, Vacuum Investment banker, operational(Extractor).  Presentation: vertex; Position: Occiput,, Anterior; Station: +3.  Verbal consent: obtained from patient. Due to maternal exhaustion. Risks and benefits discussed in detail.  Risks include, but are not limited to the risks of anesthesia, bleeding, infection, damage to maternal tissues, fetal cephalhematoma.  There is also the risk of inability to effect vaginal delivery of the head, or shoulder dystocia that cannot be resolved by established maneuvers, leading to the need for emergency cesarean section.  APGAR: 6, 9; weight 8 lb 4.3 oz (3750 g).   Placenta status: Intact, Spontaneous Pathology due to clinical placental abruption.   Cord: CAN x 1 not reducible, clamped , cut.  3 vessels with the following complications: Short.  Cord pH: none  Anesthesia: Epidural  Instruments: mushroom Episiotomy: None Lacerations: 2nd degree;Perineal Suture Repair: 3.0 chromic Est. Blood Loss (mL): 540  Mom to postpartum.  Baby to Couplet care / Skin to Skin.  Kass Herberger A 04/07/2015, 7:25 PM

## 2014-09-24 LAB — OB RESULTS CONSOLE ABO/RH: RH TYPE: POSITIVE

## 2014-09-24 LAB — OB RESULTS CONSOLE RUBELLA ANTIBODY, IGM: Rubella: IMMUNE

## 2014-09-24 LAB — OB RESULTS CONSOLE RPR: RPR: NONREACTIVE

## 2014-09-24 LAB — OB RESULTS CONSOLE HEPATITIS B SURFACE ANTIGEN: Hepatitis B Surface Ag: NEGATIVE

## 2014-09-24 LAB — OB RESULTS CONSOLE ANTIBODY SCREEN: Antibody Screen: NEGATIVE

## 2014-09-24 LAB — OB RESULTS CONSOLE HIV ANTIBODY (ROUTINE TESTING): HIV: NONREACTIVE

## 2014-09-26 LAB — OB RESULTS CONSOLE GC/CHLAMYDIA
CHLAMYDIA, DNA PROBE: NEGATIVE
GC PROBE AMP, GENITAL: NEGATIVE

## 2015-03-11 LAB — OB RESULTS CONSOLE GBS: STREP GROUP B AG: NEGATIVE

## 2015-03-20 ENCOUNTER — Encounter (HOSPITAL_COMMUNITY): Payer: Self-pay | Admitting: *Deleted

## 2015-03-20 ENCOUNTER — Inpatient Hospital Stay (HOSPITAL_COMMUNITY)
Admission: AD | Admit: 2015-03-20 | Discharge: 2015-03-20 | Disposition: A | Discharge status: Home / Self Care | Payer: 59 | Source: Ambulatory Visit | Attending: Obstetrics & Gynecology | Admitting: Obstetrics & Gynecology

## 2015-03-20 DIAGNOSIS — Z3A36 36 weeks gestation of pregnancy: Secondary | ICD-10-CM | POA: Diagnosis not present

## 2015-03-20 DIAGNOSIS — D696 Thrombocytopenia, unspecified: Secondary | ICD-10-CM

## 2015-03-20 DIAGNOSIS — O99113 Other diseases of the blood and blood-forming organs and certain disorders involving the immune mechanism complicating pregnancy, third trimester: Secondary | ICD-10-CM

## 2015-03-20 DIAGNOSIS — Z3493 Encounter for supervision of normal pregnancy, unspecified, third trimester: Secondary | ICD-10-CM | POA: Diagnosis present

## 2015-03-20 LAB — COMPREHENSIVE METABOLIC PANEL
ALT: 19 U/L (ref 14–54)
AST: 24 U/L (ref 15–41)
Albumin: 3 g/dL — ABNORMAL LOW (ref 3.5–5.0)
Alkaline Phosphatase: 113 U/L (ref 38–126)
Anion gap: 6 (ref 5–15)
BUN: 5 mg/dL — ABNORMAL LOW (ref 6–20)
CO2: 21 mmol/L — ABNORMAL LOW (ref 22–32)
Calcium: 8.5 mg/dL — ABNORMAL LOW (ref 8.9–10.3)
Chloride: 108 mmol/L (ref 101–111)
Creatinine, Ser: 0.51 mg/dL (ref 0.44–1.00)
GFR calc Af Amer: >60 mL/min (ref >60)
GFR calc non Af Amer: >60 mL/min (ref >60)
Glucose, Bld: 88 mg/dL (ref 65–99)
Potassium: 3.9 mmol/L (ref 3.5–5.1)
Sodium: 135 mmol/L (ref 135–145)
Total Bilirubin: 0.4 mg/dL (ref 0.3–1.2)
Total Protein: 6.6 g/dL (ref 6.5–8.1)

## 2015-03-20 LAB — CBC
HCT: 39.2 % (ref 36.0–46.0)
Hemoglobin: 13.2 g/dL (ref 12.0–15.0)
MCH: 31.6 pg (ref 26.0–34.0)
MCHC: 33.7 g/dL (ref 30.0–36.0)
MCV: 93.8 fL (ref 78.0–100.0)
Platelets: 107 10*3/uL — ABNORMAL LOW (ref 150–400)
RBC: 4.18 MIL/uL (ref 3.87–5.11)
RDW: 14.1 % (ref 11.5–15.5)
WBC: 8.8 10*3/uL (ref 4.0–10.5)

## 2015-03-20 LAB — URIC ACID: Uric Acid, Serum: 4.5 mg/dL (ref 2.3–6.6)

## 2015-03-20 NOTE — MAU Note (Signed)
Sent over for blood work.  BP is fine, but platelets are dropping.  Ongoing headache.

## 2015-03-20 NOTE — MAU Provider Note (Signed)
History     CSN: [id number]  Arrival date and time: 03/20/15 1710 Nurse call to provider-orders given _0  Provider here to see patient @ 1810     No chief complaint on file.  HPI  Sent from office for PEc evaluation Decreasing platelets thru pregnancy(baseline187- 73 today in office) Headache earlier but resolved with eating lunch  Past Medical History  Diagnosis Date  . Migraine     Past Surgical History  Procedure Laterality Date  . No past surgeries      No family history on file.  History  Substance Use Topics  . Smoking status: Never Smoker   . Smokeless tobacco: Not on file  . Alcohol Use: Yes     Comment: occasional    Allergies:  Allergies  Allergen Reactions  . Shellfish Allergy Hives and Nausea And Vomiting  . Latex Rash    Prescriptions prior to admission  Medication Sig Dispense Refill Last Dose  . acetaminophen (TYLENOL) 325 MG tablet Take 650 mg by mouth every 6 (six) hours as needed for headache.   03/20/2015 at Unknown time  . pantoprazole (PROTONIX) 40 MG tablet Take 40 mg by mouth daily.   03/20/2015 at Unknown time  . Prenatal Vit-Fe Fumarate-FA (PRENATAL MULTIVITAMIN) TABS tablet Take 1 tablet by mouth daily at 12 noon.   03/19/2015 at Unknown time  . calcium carbonate (TUMS - DOSED IN MG ELEMENTAL CALCIUM) 500 MG chewable tablet Chew 1 tablet by mouth daily as needed for indigestion or heartburn.   Not Taking at Unknown time    ROS  No ctx Active FM No headache, vision changes, epigastric pain  Physical Exam   Blood pressure 116/61, pulse 100, resp. rate 18, last menstrual period 07/07/2014.  Physical Exam   Alert and oriented/no PIH symptoms Abdomen soft and non-tender- gravid Defer VE NST - reactive from baseline 145 /moderate variability/ no decles EXT: 1+ dependent edema /DTR 1+  Results for WILLENE, HOLIAN (MRN [id number]) as of 03/20/2015 18:18  Ref. Range 03/20/2015 17:45  Sodium Latest Ref Range: 135-145 mmol/L 135   Potassium Latest Ref Range: 3.5-5.1 mmol/L 3.9  Chloride Latest Ref Range: 101-111 mmol/L 108  CO2 Latest Ref Range: 22-32 mmol/L 21 (L)  BUN Latest Ref Range: 6-20 mg/dL 5 (L)  Creatinine Latest Ref Range: 0.44-1.00 mg/dL 0.51  Calcium Latest Ref Range: 8.9-10.3 mg/dL 8.5 (L)  EGFR (Non-African Amer.) Latest Ref Range: >60 mL/min >60  EGFR (African American) Latest Ref Range: >60 mL/min >60  Glucose Latest Ref Range: 65-99 mg/dL 88  Anion gap Latest Ref Range: 5-15  6  Alkaline Phosphatase Latest Ref Range: 38-126 U/L 113  Albumin Latest Ref Range: 3.5-5.0 g/dL 3.0 (L)  Uric Acid, Serum Latest Ref Range: 2.3-6.6 mg/dL 4.5  AST Latest Ref Range: 15-41 U/L 24  ALT Latest Ref Range: 14-54 U/L 19  Total Protein Latest Ref Range: 6.5-8.1 g/dL 6.6  Total Bilirubin Latest Ref Range: 0.3-1.2 mg/dL 0.4   Results for JAMICIA, HAALAND (MRN [id number]) as of 03/20/2015 18:23  Ref. Range 03/20/2015 17:45  WBC Latest Ref Range: 4.0-10.5 K/uL 8.8  RBC Latest Ref Range: 3.87-5.11 MIL/uL 4.18  Hemoglobin Latest Ref Range: 12.0-15.0 g/dL 13.2  HCT Latest Ref Range: 36.0-46.0 % 39.2  MCV Latest Ref Range: 78.0-100.0 fL 93.8  MCH Latest Ref Range: 26.0-34.0 pg 31.6  MCHC Latest Ref Range: 30.0-36.0 g/dL 33.7  RDW Latest Ref Range: 11.5-15.5 % 14.1  Platelets Latest Ref Range: 150-400 K/uL 107 (L)  MAU Course  Procedures  NST - reactive  Assessment and Plan  36.4 weeks Low platelets - no evidence of PEC at this time Possible gestational thrombocytopenia versus developing toxemia of pregnancy  Dr Garwin Brothers notified -  1) DC home 2) low sodium / more water over weekend 3) rest at home 4) PIH precautions  5) keep OV next week as scheduled  Artelia Laroche 03/20/2015, 6:12 PM

## 2015-04-06 ENCOUNTER — Inpatient Hospital Stay (HOSPITAL_COMMUNITY)
Admission: AD | Admit: 2015-04-06 | Discharge: 2015-04-09 | DRG: 774 | Disposition: A | Discharge status: Home / Self Care | Payer: 59 | Source: Ambulatory Visit | Attending: Obstetrics and Gynecology | Admitting: Obstetrics and Gynecology

## 2015-04-06 ENCOUNTER — Encounter (HOSPITAL_COMMUNITY): Payer: Self-pay | Admitting: *Deleted

## 2015-04-06 ENCOUNTER — Other Ambulatory Visit: Payer: Self-pay | Admitting: Obstetrics and Gynecology

## 2015-04-06 DIAGNOSIS — O9962 Diseases of the digestive system complicating childbirth: Secondary | ICD-10-CM | POA: Diagnosis present

## 2015-04-06 DIAGNOSIS — D696 Thrombocytopenia, unspecified: Secondary | ICD-10-CM | POA: Diagnosis present

## 2015-04-06 DIAGNOSIS — O99214 Obesity complicating childbirth: Secondary | ICD-10-CM | POA: Diagnosis present

## 2015-04-06 DIAGNOSIS — D62 Acute posthemorrhagic anemia: Secondary | ICD-10-CM | POA: Diagnosis not present

## 2015-04-06 DIAGNOSIS — O9912 Other diseases of the blood and blood-forming organs and certain disorders involving the immune mechanism complicating childbirth: Secondary | ICD-10-CM | POA: Diagnosis present

## 2015-04-06 DIAGNOSIS — Z6838 Body mass index (BMI) 38.0-38.9, adult: Secondary | ICD-10-CM

## 2015-04-06 DIAGNOSIS — O9081 Anemia of the puerperium: Secondary | ICD-10-CM | POA: Diagnosis present

## 2015-04-06 DIAGNOSIS — K219 Gastro-esophageal reflux disease without esophagitis: Secondary | ICD-10-CM | POA: Diagnosis present

## 2015-04-06 DIAGNOSIS — O4593 Premature separation of placenta, unspecified, third trimester: Secondary | ICD-10-CM | POA: Diagnosis present

## 2015-04-06 DIAGNOSIS — O99119 Other diseases of the blood and blood-forming organs and certain disorders involving the immune mechanism complicating pregnancy, unspecified trimester: Secondary | ICD-10-CM | POA: Diagnosis present

## 2015-04-06 DIAGNOSIS — Z3A39 39 weeks gestation of pregnancy: Secondary | ICD-10-CM | POA: Diagnosis present

## 2015-04-06 LAB — COMPREHENSIVE METABOLIC PANEL
ALK PHOS: 141 U/L — AB (ref 38–126)
ALT: 24 U/L (ref 14–54)
ANION GAP: 5 (ref 5–15)
AST: 28 U/L (ref 15–41)
Albumin: 3 g/dL — ABNORMAL LOW (ref 3.5–5.0)
BILIRUBIN TOTAL: 0.5 mg/dL (ref 0.3–1.2)
BUN: 9 mg/dL (ref 6–20)
CALCIUM: 8.5 mg/dL — AB (ref 8.9–10.3)
CHLORIDE: 110 mmol/L (ref 101–111)
CO2: 19 mmol/L — ABNORMAL LOW (ref 22–32)
Creatinine, Ser: 0.54 mg/dL (ref 0.44–1.00)
GFR calc Af Amer: >60 mL/min (ref >60)
GFR calc non Af Amer: >60 mL/min (ref >60)
GLUCOSE: 99 mg/dL (ref 65–99)
Potassium: 4.3 mmol/L (ref 3.5–5.1)
SODIUM: 134 mmol/L — AB (ref 135–145)
TOTAL PROTEIN: 6.3 g/dL — AB (ref 6.5–8.1)

## 2015-04-06 LAB — URIC ACID: URIC ACID, SERUM: 5 mg/dL (ref 2.3–6.6)

## 2015-04-06 LAB — CBC
HEMATOCRIT: 39.9 % (ref 36.0–46.0)
Hemoglobin: 13.4 g/dL (ref 12.0–15.0)
MCH: 31.8 pg (ref 26.0–34.0)
MCHC: 33.6 g/dL (ref 30.0–36.0)
MCV: 94.8 fL (ref 78.0–100.0)
PLATELETS: 114 10*3/uL — AB (ref 150–400)
RBC: 4.21 MIL/uL (ref 3.87–5.11)
RDW: 14.6 % (ref 11.5–15.5)
WBC: 10.1 10*3/uL (ref 4.0–10.5)

## 2015-04-06 MED ORDER — BUTORPHANOL TARTRATE 2 MG/ML IJ SOLN
2.0000 mg | INTRAMUSCULAR | Status: DC | PRN
  Administered 2015-04-07 (×2): 2 mg via INTRAVENOUS
  Filled 2015-04-06 (×2): qty 2

## 2015-04-06 MED ORDER — OXYTOCIN 40 UNITS IN LACTATED RINGERS INFUSION - SIMPLE MED
1.0000 m[IU]/min | INTRAVENOUS | Status: DC
  Administered 2015-04-06: 2 m[IU]/min via INTRAVENOUS

## 2015-04-06 MED ORDER — ACETAMINOPHEN 325 MG PO TABS: 650.0000 mg | ORAL_TABLET | ORAL | Status: DC | PRN

## 2015-04-06 MED ORDER — CITRIC ACID-SODIUM CITRATE 334-500 MG/5ML PO SOLN
30.0000 mL | ORAL | Status: DC | PRN
  Filled 2015-04-06: qty 15

## 2015-04-06 MED ORDER — OXYCODONE-ACETAMINOPHEN 5-325 MG PO TABS: 1.0000 | ORAL_TABLET | ORAL | Status: DC | PRN

## 2015-04-06 MED ORDER — LIDOCAINE HCL (PF) 1 % IJ SOLN
30.0000 mL | INTRAMUSCULAR | Status: DC | PRN
  Administered 2015-04-07: 30 mL via SUBCUTANEOUS
  Filled 2015-04-06: qty 30

## 2015-04-06 MED ORDER — ONDANSETRON HCL 4 MG/2ML IJ SOLN
4.0000 mg | Freq: Four times a day (QID) | INTRAMUSCULAR | Status: DC | PRN
  Administered 2015-04-07 (×2): 4 mg via INTRAVENOUS
  Filled 2015-04-06 (×2): qty 2

## 2015-04-06 MED ORDER — TERBUTALINE SULFATE 1 MG/ML IJ SOLN: 0.2500 mg | Freq: Once | INTRAMUSCULAR | Status: AC | PRN

## 2015-04-06 MED ORDER — OXYCODONE-ACETAMINOPHEN 5-325 MG PO TABS: 2.0000 | ORAL_TABLET | ORAL | Status: DC | PRN

## 2015-04-06 MED ORDER — OXYTOCIN 40 UNITS IN LACTATED RINGERS INFUSION - SIMPLE MED
62.5000 mL/h | INTRAVENOUS | Status: DC
  Filled 2015-04-06: qty 1000

## 2015-04-06 MED ORDER — LACTATED RINGERS IV SOLN
INTRAVENOUS | Status: DC
Start: 2015-04-06 — End: 2015-04-08
  Administered 2015-04-06 – 2015-04-07 (×3): via INTRAVENOUS

## 2015-04-06 MED ORDER — LACTATED RINGERS IV SOLN: 500.0000 mL | INTRAVENOUS | Status: DC | PRN

## 2015-04-06 MED ORDER — OXYTOCIN BOLUS FROM INFUSION: 500.0000 mL | INTRAVENOUS | Status: DC

## 2015-04-06 MED ORDER — OXYTOCIN 10 UNIT/ML IJ SOLN: 10.0000 [IU] | Freq: Once | INTRAMUSCULAR | Status: DC

## 2015-04-06 NOTE — H&P (Signed)
Rebecca Strickland is a 24 y.o. female presenting for IOL @ 39 weeks 2nd to gestational thrombocytopenia.  Pt notes leg swelling but denies other PIH warning signs. Last PIH labs 7/1 was nl Maternal Medical History:  Reason for admission: Contractions.   Contractions: Frequency: irregular.    Fetal activity: Perceived fetal activity is normal.    Prenatal complications: Thrombocytopenia.     OB History    Gravida Para Term Preterm AB TAB SAB Ectopic Multiple Living   1              Past Medical History  Diagnosis Date  . Migraine    Past Surgical History  Procedure Laterality Date  . No past surgeries     Family History: family history is not on file. Social History:  reports that she has never smoked. She does not have any smokeless tobacco history on file. She reports that she drinks alcohol. She reports that she does not use illicit drugs.   Prenatal Transfer Tool  Maternal Diabetes: No Genetic Screening: Declined Maternal Ultrasounds/Referrals: Normal Fetal Ultrasounds or other Referrals:  None Maternal Substance Abuse:  No Significant Maternal Medications:  None Significant Maternal Lab Results:  Lab values include: Group B Strep negative Other Comments:  Gestational thrombocytopenia  ROSneg    Last menstrual period 07/07/2014. Exam Physical Exam  Constitutional: She appears well-developed and well-nourished.  HENT:  Head: Atraumatic.  Eyes: EOM are normal.  Neck: Neck supple.  Cardiovascular: Regular rhythm.   Respiratory: Breath sounds normal.  GI: Soft. Bowel sounds are normal.  Musculoskeletal: She exhibits edema.  Skin: Skin is warm and dry.   VE: loose 1 cm/60/-3  Intracervical balloon placed Prenatal labs: ABO, Rh: O/Positive/-- (01/06 0000) Antibody: Negative (01/06 0000) Rubella: Immune (01/06 0000) RPR: Nonreactive (01/06 0000)  HBsAg: Negative (01/06 0000)  HIV: Non-reactive (01/06 0000)  GBS: Negative (06/22 0000)    Assessment/Plan: Gestational thrombocytopenia Term gestation P) admit. Routine labs. Recheck PIH labs. IV pitocin, analgesic prn  Aprile Dickenson A 04/06/2015, 8:58 PM

## 2015-04-07 ENCOUNTER — Encounter (HOSPITAL_COMMUNITY): Payer: Self-pay | Admitting: Anesthesiology

## 2015-04-07 ENCOUNTER — Inpatient Hospital Stay (HOSPITAL_COMMUNITY): Payer: 59 | Admitting: Anesthesiology

## 2015-04-07 LAB — RPR: RPR Ser Ql: NONREACTIVE

## 2015-04-07 LAB — CBC
HCT: 38.6 % (ref 36.0–46.0)
HCT: 36.1 % (ref 36.0–46.0)
HEMOGLOBIN: 12.3 g/dL (ref 12.0–15.0)
Hemoglobin: 13.1 g/dL (ref 12.0–15.0)
MCH: 31.7 pg (ref 26.0–34.0)
MCH: 32.1 pg (ref 26.0–34.0)
MCHC: 33.9 g/dL (ref 30.0–36.0)
MCHC: 34.1 g/dL (ref 30.0–36.0)
MCV: 93.5 fL (ref 78.0–100.0)
MCV: 94.3 fL (ref 78.0–100.0)
PLATELETS: 95 10*3/uL — AB (ref 150–400)
Platelets: 95 10*3/uL — ABNORMAL LOW (ref 150–400)
RBC: 4.13 MIL/uL (ref 3.87–5.11)
RBC: 3.83 MIL/uL — ABNORMAL LOW (ref 3.87–5.11)
RDW: 14.4 % (ref 11.5–15.5)
RDW: 14.4 % (ref 11.5–15.5)
WBC: 14.1 10*3/uL — ABNORMAL HIGH (ref 4.0–10.5)
WBC: 19.5 10*3/uL — ABNORMAL HIGH (ref 4.0–10.5)

## 2015-04-07 LAB — FIBRINOGEN: FIBRINOGEN: 449 mg/dL (ref 204–475)

## 2015-04-07 LAB — PREPARE RBC (CROSSMATCH)

## 2015-04-07 LAB — PLATELET COUNT: Platelets: 110 10*3/uL — ABNORMAL LOW (ref 150–400)

## 2015-04-07 LAB — PROTIME-INR
INR: 1.11 (ref 0.00–1.49)
Prothrombin Time: 14.5 seconds (ref 11.6–15.2)

## 2015-04-07 LAB — APTT: aPTT: 29 seconds (ref 24–37)

## 2015-04-07 LAB — SAVE SMEAR: SMEAR REVIEW: NONE SEEN

## 2015-04-07 MED ORDER — DIPHENHYDRAMINE HCL 50 MG/ML IJ SOLN: 12.5000 mg | INTRAMUSCULAR | Status: DC | PRN

## 2015-04-07 MED ORDER — ACETAMINOPHEN 325 MG PO TABS: 650.0000 mg | ORAL_TABLET | ORAL | Status: DC | PRN

## 2015-04-07 MED ORDER — LANOLIN HYDROUS EX OINT: TOPICAL_OINTMENT | CUTANEOUS | Status: DC | PRN

## 2015-04-07 MED ORDER — IBUPROFEN 600 MG PO TABS
600.0000 mg | ORAL_TABLET | Freq: Four times a day (QID) | ORAL | Status: DC
  Administered 2015-04-07 – 2015-04-08 (×3): 600 mg via ORAL
  Filled 2015-04-07 (×3): qty 1

## 2015-04-07 MED ORDER — ZOLPIDEM TARTRATE 5 MG PO TABS: 5.0000 mg | ORAL_TABLET | Freq: Every evening | ORAL | Status: DC | PRN

## 2015-04-07 MED ORDER — FENTANYL 2.5 MCG/ML BUPIVACAINE 1/10 % EPIDURAL INFUSION (WH - ANES)
14.0000 mL/h | INTRAMUSCULAR | Status: DC | PRN
  Administered 2015-04-07 (×3): 14 mL/h via EPIDURAL
  Filled 2015-04-07 (×2): qty 125

## 2015-04-07 MED ORDER — METHYLERGONOVINE MALEATE 0.2 MG/ML IJ SOLN
INTRAMUSCULAR | Status: AC
  Filled 2015-04-07: qty 1

## 2015-04-07 MED ORDER — DIBUCAINE 1 % RE OINT: 1.0000 "application " | TOPICAL_OINTMENT | RECTAL | Status: DC | PRN

## 2015-04-07 MED ORDER — METHYLERGONOVINE MALEATE 0.2 MG/ML IJ SOLN
0.2000 mg | Freq: Once | INTRAMUSCULAR | Status: AC
  Administered 2015-04-07: 0.2 mg via INTRAMUSCULAR

## 2015-04-07 MED ORDER — EPHEDRINE 5 MG/ML INJ
10.0000 mg | INTRAVENOUS | Status: DC | PRN
  Filled 2015-04-07: qty 2

## 2015-04-07 MED ORDER — PRENATAL MULTIVITAMIN CH
1.0000 | ORAL_TABLET | Freq: Every day | ORAL | Status: DC
  Administered 2015-04-08 – 2015-04-09 (×2): 1 via ORAL
  Filled 2015-04-07 (×2): qty 1

## 2015-04-07 MED ORDER — MEASLES, MUMPS & RUBELLA VAC ~~LOC~~ INJ: 0.5000 mL | INJECTION | Freq: Once | SUBCUTANEOUS | Status: DC

## 2015-04-07 MED ORDER — SENNOSIDES-DOCUSATE SODIUM 8.6-50 MG PO TABS
2.0000 | ORAL_TABLET | ORAL | Status: DC
  Administered 2015-04-09: 2 via ORAL
  Filled 2015-04-07 (×2): qty 2

## 2015-04-07 MED ORDER — OXYCODONE-ACETAMINOPHEN 5-325 MG PO TABS
1.0000 | ORAL_TABLET | ORAL | Status: DC | PRN
  Administered 2015-04-08 – 2015-04-09 (×3): 1 via ORAL
  Filled 2015-04-07 (×3): qty 1

## 2015-04-07 MED ORDER — ONDANSETRON HCL 4 MG PO TABS: 4.0000 mg | ORAL_TABLET | ORAL | Status: DC | PRN

## 2015-04-07 MED ORDER — BENZOCAINE-MENTHOL 20-0.5 % EX AERO
1.0000 "application " | INHALATION_SPRAY | CUTANEOUS | Status: DC | PRN
  Administered 2015-04-08: 1 via TOPICAL
  Filled 2015-04-07: qty 56

## 2015-04-07 MED ORDER — SIMETHICONE 80 MG PO CHEW: 80.0000 mg | CHEWABLE_TABLET | ORAL | Status: DC | PRN

## 2015-04-07 MED ORDER — ONDANSETRON HCL 4 MG/2ML IJ SOLN: 4.0000 mg | INTRAMUSCULAR | Status: DC | PRN

## 2015-04-07 MED ORDER — SODIUM CHLORIDE 0.9 % IV SOLN: Freq: Once | INTRAVENOUS | Status: DC

## 2015-04-07 MED ORDER — TETANUS-DIPHTH-ACELL PERTUSSIS 5-2.5-18.5 LF-MCG/0.5 IM SUSP: 0.5000 mL | Freq: Once | INTRAMUSCULAR | Status: DC

## 2015-04-07 MED ORDER — WITCH HAZEL-GLYCERIN EX PADS: 1.0000 "application " | MEDICATED_PAD | CUTANEOUS | Status: DC | PRN

## 2015-04-07 MED ORDER — DIPHENHYDRAMINE HCL 25 MG PO CAPS: 25.0000 mg | ORAL_CAPSULE | Freq: Four times a day (QID) | ORAL | Status: DC | PRN

## 2015-04-07 MED ORDER — FENTANYL 2.5 MCG/ML BUPIVACAINE 1/10 % EPIDURAL INFUSION (WH - ANES): 14.0000 mL/h | INTRAMUSCULAR | Status: DC | PRN

## 2015-04-07 MED ORDER — PHENYLEPHRINE 40 MCG/ML (10ML) SYRINGE FOR IV PUSH (FOR BLOOD PRESSURE SUPPORT): 80.0000 ug | PREFILLED_SYRINGE | INTRAVENOUS | Status: DC | PRN

## 2015-04-07 MED ORDER — IBUPROFEN 600 MG PO TABS
600.0000 mg | ORAL_TABLET | Freq: Once | ORAL | Status: AC
  Administered 2015-04-07: 600 mg via ORAL
  Filled 2015-04-07: qty 1

## 2015-04-07 MED ORDER — PHENYLEPHRINE 40 MCG/ML (10ML) SYRINGE FOR IV PUSH (FOR BLOOD PRESSURE SUPPORT)
80.0000 ug | PREFILLED_SYRINGE | INTRAVENOUS | Status: DC | PRN
  Filled 2015-04-07: qty 20
  Filled 2015-04-07: qty 2

## 2015-04-07 MED ORDER — LIDOCAINE HCL (PF) 1 % IJ SOLN
INTRAMUSCULAR | Status: DC | PRN
  Administered 2015-04-07 (×2): 4 mL via EPIDURAL

## 2015-04-07 MED ORDER — OXYCODONE-ACETAMINOPHEN 5-325 MG PO TABS: 2.0000 | ORAL_TABLET | ORAL | Status: DC | PRN

## 2015-04-07 NOTE — Anesthesia Preprocedure Evaluation (Signed)
Anesthesia Evaluation  Patient identified by MRN, date of birth, ID band Patient awake    Reviewed: Allergy & Precautions, Patient's Chart, lab work & pertinent test results  Airway Mallampati: III  TM Distance: >3 FB Neck ROM: Full    Dental no notable dental hx. (+) Teeth Intact   Pulmonary neg pulmonary ROS,  breath sounds clear to auscultation  Pulmonary exam normal       Cardiovascular negative cardio ROS Normal cardiovascular examRhythm:Regular Rate:Normal     Neuro/Psych negative neurological ROS  negative psych ROS   GI/Hepatic Neg liver ROS, GERD-  Medicated and Controlled,  Endo/Other  Morbid obesity  Renal/GU negative Renal ROS     Musculoskeletal negative musculoskeletal ROS (+)   Abdominal (+) + obese,   Peds  Hematology Thrombocytopenia   Anesthesia Other Findings   Reproductive/Obstetrics (+) Pregnancy                             Anesthesia Physical Anesthesia Plan  ASA: III  Anesthesia Plan: Epidural   Post-op Pain Management:    Induction:   Airway Management Planned: Natural Airway  Additional Equipment:   Intra-op Plan:   Post-operative Plan:   Informed Consent: I have reviewed the patients History and Physical, chart, labs and discussed the procedure including the risks, benefits and alternatives for the proposed anesthesia with the patient or authorized representative who has indicated his/her understanding and acceptance.     Plan Discussed with: Anesthesiologist  Anesthesia Plan Comments:         Anesthesia Quick Evaluation

## 2015-04-07 NOTE — Progress Notes (Signed)
S: comfortable Epidural Pitocin RN called regarding increased vaginal bleeding O: Blood pressure 128/62, pulse 89, temperature 98.1 F (36.7 C), temperature source Oral, resp. rate 18, height 5\' 3"  (1.6 m), weight 99.338 kg (219 lb), last menstrual period 07/07/2014. VE 5/80/-2 LOA 150 cc dark clots removed  Tracing: baseline 135 (+) early decels Triplet ctxs spaced 4 mins apart  IMP: placental abruption ? Etiology Arrest of active phase due to dysfunctional ctxs Gestational thrombocytopenia P) DIC panel. Right exaggerated sims. Increase pitocin to get reg ctx in order to facilitate cervical change

## 2015-04-07 NOTE — Progress Notes (Signed)
S:  Notes ctx( painful)  Balloon out O: Pitocin 14 miu VE 4-5/60/-3 AROM. Clear fluid slight blood tinged   Tracing: baseline 135 (+) accels Ctx q 2-3 mins  IMP: active phase  P) left exaggerated sims. Cont pitocin

## 2015-04-07 NOTE — Anesthesia Procedure Notes (Signed)
Epidural Patient location during procedure: OB Start time: 04/07/2015 6:00 AM  Staffing Anesthesiologist: Mal AmabileFOSTER, Jasamine Pottinger Performed by: anesthesiologist   Preanesthetic Checklist Completed: patient identified, site marked, surgical consent, pre-op evaluation, timeout performed, IV checked, risks and benefits discussed and monitors and equipment checked  Epidural Patient position: sitting Prep: site prepped and draped and DuraPrep Patient monitoring: continuous pulse ox and blood pressure Approach: midline Location: L3-L4 Injection technique: LOR air  Needle:  Needle type: Tuohy  Needle gauge: 17 G Needle length: 9 cm and 9 Needle insertion depth: 7 cm Catheter type: closed end flexible Catheter size: 19 Gauge Catheter at skin depth: 12 cm Test dose: negative and Other  Assessment Events: blood not aspirated, injection not painful, no injection resistance, negative IV test and no paresthesia  Additional Notes Patient identified. Risks and benefits discussed including failed block, incomplete  Pain control, post dural puncture headache, nerve damage, paralysis, blood pressure Changes, nausea, vomiting, reactions to medications-both toxic and allergic and post Partum back pain. All questions were answered. Patient expressed understanding and wished to proceed. Sterile technique was used throughout procedure. Epidural site was Dressed with sterile barrier dressing. No paresthesias, signs of intravascular injection Or signs of intrathecal spread were encountered.  Patient was more comfortable after the epidural was dosed. Please see RN's note for documentation of vital signs and FHR which are stable.

## 2015-04-07 NOTE — Progress Notes (Signed)
S: pushing  O:  T 99.3( ax) tracing: baseline145-150 (+) variable with ctx Ctx q 2 mins  IMP: placental abruption Complete  P) cont pushing

## 2015-04-07 NOTE — Progress Notes (Signed)
S: notes pelvic/rectal pressure  O: pitocin  Epidural VE fully /100/-1 Dark clots noted Tracing: baseline 145 Ctx q 2 mins (+) scalp stim  DIC panel nl.  plt 110 IMP: complete Placental abruption with reassuring tracing currently P) labor vtx down. Cont close fetal monitor

## 2015-04-08 LAB — CBC
HEMATOCRIT: 30 % — AB (ref 36.0–46.0)
Hemoglobin: 10 g/dL — ABNORMAL LOW (ref 12.0–15.0)
MCH: 31.6 pg (ref 26.0–34.0)
MCHC: 33.3 g/dL (ref 30.0–36.0)
MCV: 94.9 fL (ref 78.0–100.0)
PLATELETS: 85 10*3/uL — AB (ref 150–400)
RBC: 3.16 MIL/uL — ABNORMAL LOW (ref 3.87–5.11)
RDW: 14.8 % (ref 11.5–15.5)
WBC: 16.5 10*3/uL — ABNORMAL HIGH (ref 4.0–10.5)

## 2015-04-08 MED ORDER — PANTOPRAZOLE SODIUM 40 MG PO TBEC
40.0000 mg | DELAYED_RELEASE_TABLET | Freq: Every day | ORAL | Status: DC
  Administered 2015-04-08 – 2015-04-09 (×2): 40 mg via ORAL
  Filled 2015-04-08 (×2): qty 1

## 2015-04-08 NOTE — Lactation Note (Addendum)
This note was copied from the chart of Rebecca Strickland. Lactation Consultation Note Follow up visit at 28 hours of age.  Baby is fussy and ready to feed.  Baby cluster fed a few times early today.  Mom has a compression stripe on right breast with white "bleb" on tip of nipple.  No colostrum visible to compress and rub into nipple, encouraged mom to keep trying.  Baby latched well in cross cradle hold.  Mom reports this is a deeper latch and feels better.  Baby has good strong sucking pattern with visible jaw excursions.  Baby maintains feeding well for about 15 minutes.  LC walked out of room to get mom shells for sore nipples. [facility] reports baby is slipping down and asks for help repositioning.  Baby noted to be dusky.  Pulled away from breast and stimulated.  Baby is sleepy and turned pink with stimulation.  Encouraged mom to watch baby for feedings and make sure breast does not cover nose.  With positioning showed mom how to allow airspace without pulling breast away from baby.  Mom knows to call for emergency assist if baby changes colors again.  Mom reports previous choking episode last night. Baby continues in slow feeding rhythm.  [facility], Rebecca [facility].  Mom plans to pump and bottle feed and asks when a good time to start that will be.  Encouraged mom to latch baby on demand for 1st 2 weeks to establish a good milk supply.      Patient Name: Rebecca StricklandUToday's Date: 04/08/2015 Reason for consult: Follow-up assessment   Maternal Data Has patient been taught Hand Expression?: Yes Does the patient have breastfeeding experience prior to this delivery?: No  Feeding Feeding Type: Breast Fed Length of feed:  (observed 20 minutes)  LATCH Score/Interventions Latch: Grasps breast easily, tongue down, lips flanged, rhythmical sucking. Intervention(s): Adjust position;Assist with latch;Breast compression;Breast massage  Audible Swallowing: A few with  stimulation  Type of Nipple: Everted at rest and after stimulation Intervention(s): Shells (for soreness)  Comfort (Breast/Nipple): Filling, red/small blisters or bruises, mild/mod discomfort  Problem noted: Mild/Moderate discomfort (bruising) Interventions (Mild/moderate discomfort): Hand expression;Hand massage  Hold (Positioning): Assistance needed to correctly position infant at breast and maintain latch. Intervention(s): Breastfeeding basics reviewed;Support Pillows;Position options;Skin to skin  LATCH Score: 7  Lactation Tools Discussed/Used     Consult Status Consult Status: Follow-up Date: 04/09/15 Follow-up type: In-patient    Rebecca Strickland, Rebecca Strickland 04/08/2015, 6:04 PM

## 2015-04-08 NOTE — Progress Notes (Signed)
Patient ID: Rebecca Strickland, female   DOB: Mar 06, 1991, 24 y.o.   MRN: [id number] PPD # 1 VAVD  S:  Reports feeling well             Tolerating po/ No nausea or vomiting             Bleeding is light             Pain controlled with ibuprofen (OTC)             Up ad lib / ambulatory / voiding without difficulties    Newborn  Information for the patient's newborn:  Rebecca Strickland, Rebecca Strickland [[id number]][[id number]]  female  breast feeding  / Circumcision planning   O:  A & O x 3, in no apparent distress              VS:  Filed Vitals:   04/07/15 1545 04/07/15 1700 04/07/15 2205 04/08/15 0554  BP: 129/74 122/66 116/62 104/84  Pulse: 99 111 100 84  Temp: 99.7 F (37.6 C) 98.9 F (37.2 C) 98.5 F (36.9 C) 98.1 F (36.7 C)  TempSrc: Oral Oral Oral Oral  Resp: 18 18 18 18   Height:      Weight:      SpO2: 99% 97% 98%     LABS:  Recent Labs  04/07/15 1449 04/08/15 0535  WBC 19.5* 16.5*  HGB 12.3 10.0*  HCT 36.1 30.0*  PLT 95* 85*    Blood type: --/--/O POS (07/18 2145)  Rubella: Immune (01/06 0000)   I&O: I/O last 3 completed shifts: In: -  Out: 540 [Blood:540]             Lungs: Clear and unlabored  Heart: regular rate and rhythm / no murmurs  Abdomen: soft, non-tender, non-distended              Fundus: firm, non-tender, U-1  Perineum: 2nd degree repair healing well   Lochia: minimal  Extremities: No edema, no calf pain or tenderness, No Homans    A/P: PPD # 1  24 y.o., G1P1001   Principal Problem:    Postpartum care following vaginal delivery (7/19)  Active Problems:    Gestational thrombocytopenia without hemorrhage - unstable  Placental Abruption  ABL Anemia   Doing well - stable status  Routine post partum orders  Hold Motrin - advised will need Percocet for pain control  Repeat Plts in AM  Anticipate discharge tomorrow    Rebecca MoraAWSON, Rebecca Strickland, M, MSN, CNM 04/08/2015, 2:24 PM

## 2015-04-08 NOTE — Lactation Note (Signed)
This note was copied from the chart of Rebecca Waymon BudgeChelsea Bonfanti. Lactation Consultation Note  Patient Name: Rebecca Strickland Today's Date: 04/08/2015 Reason for consult: Initial assessment (enc mom to page with feeding cues )  Baby is 24 hours old and has been to the breast several times , per mom recently fed 3x's since 1150 am .  Presently baby skin to skin on moms chest sound asleep.  LC reviewed the importance of skin to skin with feedings and in between. Per mom after feeding several times in a row , nipples feel alittle tender.  LC recommended calling with feeding cues so her nipples can be checked and feeding assessment to check latch. Mother informed of post-discharge support and given phone number to the lactation department, including services for phone  call assistance; out-patient appointments; and breastfeeding support group. List of other breastfeeding resources in the community  given in the handout. Encouraged mother to call for problems or concerns related to breastfeeding.   Maternal Data    Feeding Feeding Type:  (per mom recently BF for 10 mins at 1350 ) Length of feed: 10 min (per mom )  LATCH Score/Interventions                Intervention(s): Breastfeeding basics reviewed     Lactation Tools Discussed/Used     Consult Status Consult Status: Follow-up Date: 04/08/15 Follow-up type: In-patient    Kathrin Greathouseorio, Neziah Braley Ann 04/08/2015, 2:30 PM

## 2015-04-08 NOTE — Anesthesia Postprocedure Evaluation (Signed)
Anesthesia Post Note  Patient: Rebecca Strickland  Procedure(s) Performed: * No procedures listed *  Anesthesia type: Epidural  Patient location: Mother/Baby  Post pain: Pain level controlled  Post assessment: Post-op Vital signs reviewed  Last Vitals:  Filed Vitals:   04/08/15 0554  BP: 104/84  Pulse: 84  Temp: 36.7 C  Resp: 18    Post vital signs: Reviewed  Level of consciousness:alert  Complications: No apparent anesthesia complications

## 2015-04-09 DIAGNOSIS — D62 Acute posthemorrhagic anemia: Secondary | ICD-10-CM | POA: Diagnosis not present

## 2015-04-09 LAB — CBC
HCT: 31.3 % — ABNORMAL LOW (ref 36.0–46.0)
Hemoglobin: 10.3 g/dL — ABNORMAL LOW (ref 12.0–15.0)
MCH: 31.5 pg (ref 26.0–34.0)
MCHC: 32.9 g/dL (ref 30.0–36.0)
MCV: 95.7 fL (ref 78.0–100.0)
Platelets: 107 10*3/uL — ABNORMAL LOW (ref 150–400)
RBC: 3.27 MIL/uL — ABNORMAL LOW (ref 3.87–5.11)
RDW: 14.8 % (ref 11.5–15.5)
WBC: 14.8 10*3/uL — AB (ref 4.0–10.5)

## 2015-04-09 MED ORDER — IBUPROFEN 600 MG PO TABS: 600.0000 mg | ORAL_TABLET | Freq: Four times a day (QID) | ORAL | Status: DC

## 2015-04-09 MED ORDER — SUCROSE 24% NICU/PEDS ORAL SOLUTION
OROMUCOSAL | Status: AC
  Filled 2015-04-09: qty 1

## 2015-04-09 MED ORDER — LIDOCAINE 1%/NA BICARB 0.1 MEQ INJECTION
INJECTION | INTRAVENOUS | Status: AC
  Filled 2015-04-09: qty 1

## 2015-04-09 MED ORDER — GELATIN ABSORBABLE 12-7 MM EX MISC
CUTANEOUS | Status: AC
  Filled 2015-04-09: qty 1

## 2015-04-09 MED ORDER — ACETAMINOPHEN FOR CIRCUMCISION 160 MG/5 ML
ORAL | Status: AC
  Filled 2015-04-09: qty 1.25

## 2015-04-09 MED ORDER — OXYCODONE-ACETAMINOPHEN 5-325 MG PO TABS: 1.0000 | ORAL_TABLET | ORAL | Status: DC | PRN

## 2015-04-09 MED ORDER — IBUPROFEN 600 MG PO TABS
600.0000 mg | ORAL_TABLET | Freq: Four times a day (QID) | ORAL | Status: DC
  Administered 2015-04-09: 600 mg via ORAL
  Filled 2015-04-09: qty 1

## 2015-04-09 NOTE — Progress Notes (Signed)
Telephone call to R. Arita Miss CNM regarding recheck of platelet count today. No order noted for today. Order entered per verbal telephone order.

## 2015-04-09 NOTE — Lactation Note (Signed)
This note was copied from the chart of Boy Waymon Budge. Lactation Consultation Note  Patient Name: Boy Agam Tuohy [id number] Date: 04/09/2015 Reason for consult: Follow-up assessment  Baby 43 hours old. Mom reports that baby just finished nursing and is latching much better now. Mom states that she is hearing a lot of swallows while baby at breast and BF is much more comfortable. Mom reports that baby had been cluster-feeding a lots and that she did let baby have a pacifier and then the feedings were much better. Discussed rationale behind our recommendations of avoiding pacifier, but enc mom to use her judgement based on whether baby still nursing well every 2-3 hours. Referred mom to Baby and Me booklet for number of diapers to expect by day of life and EBM storage guidelines. Discussed engorgement prevention/treatment. Mom aware of OP/BFSG and LC phone line assistance after D/C. Maternal Data    Feeding    LATCH Score/Interventions                      Lactation Tools Discussed/Used     Consult Status Consult Status: PRN    Geralynn Ochs 04/09/2015, 9:04 AM

## 2015-04-09 NOTE — Progress Notes (Signed)
PPD #2- SVD  Subjective:   Reports feeling well, ready for discharge Tolerating po/ No nausea or vomiting Bleeding is light Pain controlled with Motrin and Percocet Up ad lib / ambulatory / voiding without problems Newborn: breastfeeding  / Circumcision: planning today   Objective:   VS: VS:  Filed Vitals:   04/07/15 2205 04/08/15 0554 04/08/15 1847 04/09/15 0540  BP: 116/62 104/84 107/65 113/63  Pulse: 100 84 98 83  Temp: 98.5 F (36.9 C) 98.1 F (36.7 C) 97.3 F (36.3 C) 98.3 F (36.8 C)  TempSrc: Oral Oral Oral Oral  Resp: Height:      Weight:      SpO2: 98%       LABS:  Recent Labs  04/08/15 0535 04/09/15 0720  WBC 16.5* 14.8*  HGB 10.0* 10.3*  PLT 85* 107*   Blood type: --/--/O POS (07/18 2145) Rubella: Immune (01/06 0000)                I&O: Intake/Output      07/20 0701 - 07/21 0700 07/21 0701 - 07/22 0700   Blood     Total Output       Net              Physical Exam: Alert and oriented X3 Abdomen: soft, non-tender, non-distended  Fundus: firm, non-tender, U-2 Perineum: Well approximated, no significant erythema, edema, or drainage; healing well. Lochia: small Extremities: No edema, no calf pain or tenderness    Assessment: PPD #2  G1P1001/ S/P:induced vaginal, VAVD, 2nd degree laceration ABL anemia Doing well - stable for discharge home   Plan: Discharge home RX's:  Ibuprofen  po Q 6 hrs prn pain #30 Refill x 0 Percocet 5/325 1 to 2 po Q 4 hrs prn pain #30 Refill x 0 Routine pp visit in 6wks at [facility] Ob/[facility]yn booklet given    Donette Larry, N MSN, CNM 04/09/2015, 10:28 AM

## 2015-04-09 NOTE — Discharge Summary (Signed)
Obstetric Discharge Summary Reason for Admission: induction of labor and 39 weeks, Gestational thrombocytopenia Prenatal Procedures: gestational thrombocytopenia, ultrasound Intrapartum Procedures: Pitocin, AROM, epidural, heavy bleeding w/suspected abruption,  vacuum Postpartum Procedures: none Complications-Operative and Postpartum: 2nd degree perineal laceration and ABL anemia-EBL 540 ml HEMOGLOBIN  Date Value Ref Range Status  04/09/2015 10.3* 12.0 - 15.0 g/dL Final   HCT  Date Value Ref Range Status  04/09/2015 31.3* 36.0 - 46.0 % Final    Physical Exam:  General: alert, cooperative and no distress Lochia: appropriate Uterine Fundus: firm Incision: healing well, no significant drainage, no dehiscence, no significant erythema DVT Evaluation: No evidence of DVT seen on physical exam. Negative Homan's sign. No cords or calf tenderness. No significant calf/ankle edema.  Discharge Diagnoses: Term Pregnancy-delivered, Gestational thrombocytopenia, delivered, ABL anemia  Discharge Information: Date: 04/09/2015 Activity: pelvic rest Diet: routine Medications: PNV, Ibuprofen and Percocet Condition: stable Instructions: refer to practice specific booklet Discharge to: home Follow-up Information    Follow up with Emmarae Cowdery A, MD. Schedule an appointment as soon as possible for a visit in 6 weeks.   Specialty:  Obstetrics and Gynecology   Contact information:   [address] [name] [address] [phone]       Newborn Data: Live born female on 04/07/15 Birth Weight: 8 lb 4.3 oz (3750 g) APGAR: 6, 9  Home with mother.  BHAMBRI, MELANIE, N 04/09/2015, 11:11 AM

## 2015-04-10 LAB — TYPE AND SCREEN
ABO/RH(D): O POS
ANTIBODY SCREEN: NEGATIVE
UNIT DIVISION: 0
Unit division: 0

## 2016-06-07 IMAGING — US US OB COMP LESS 14 WK
1 series · 14 of 28 positions shown · non-contrast
Comparison: None.

CLINICAL DATA: Vaginal bleeding, cramping.

EXAM:
OBSTETRIC <14 WK US AND TRANSVAGINAL OB US
TECHNIQUE: Both transabdominal and transvaginal ultrasound examinations were
performed for complete evaluation of the gestation as well as the
maternal uterus, adnexal regions, and pelvic cul-de-sac.
Transvaginal technique was performed to assess early pregnancy.

[Series 1: us ob comp less 14 wks · 45 acquisitions, 14 frames shown]
[im 2/45]
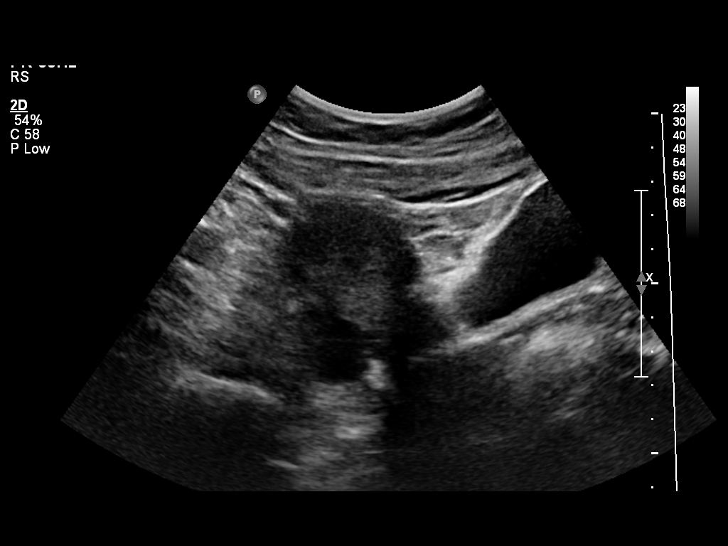
[im 5/45]
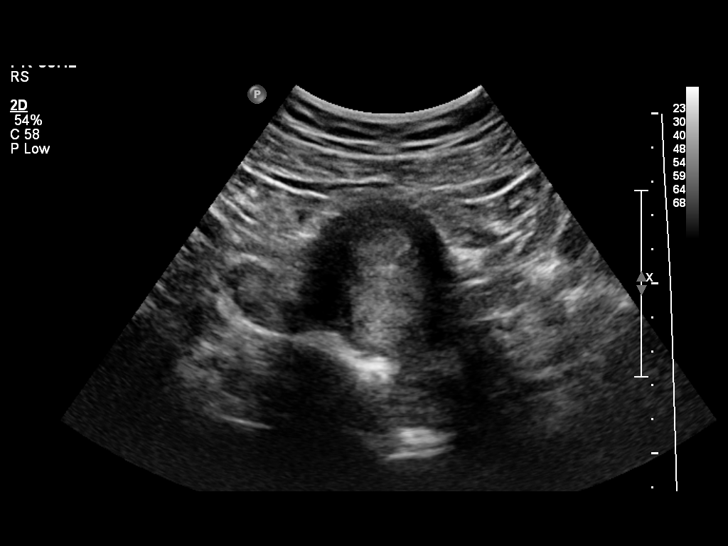
[im 9/45]
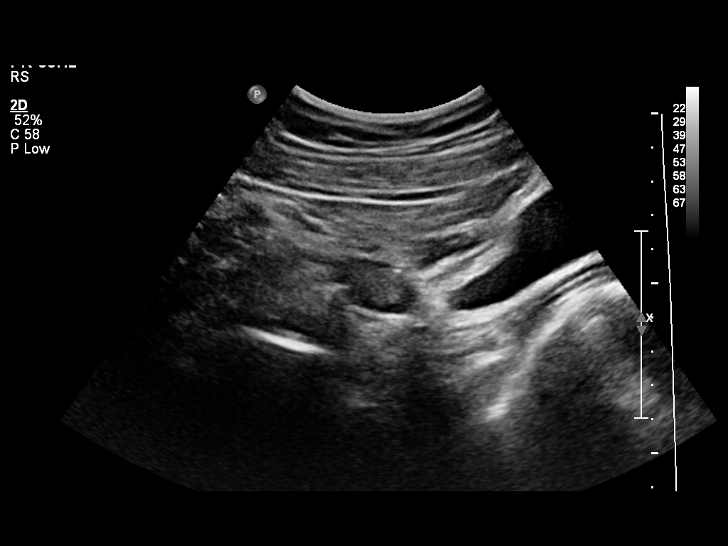
[im 12/45]
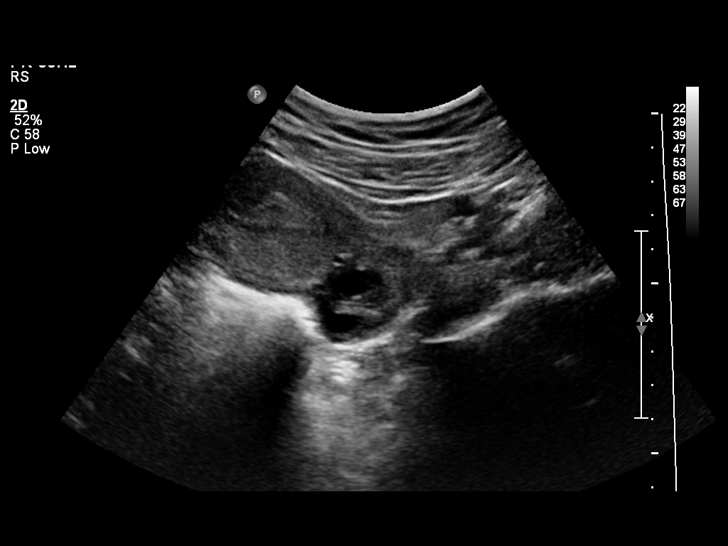
[im 15/45]
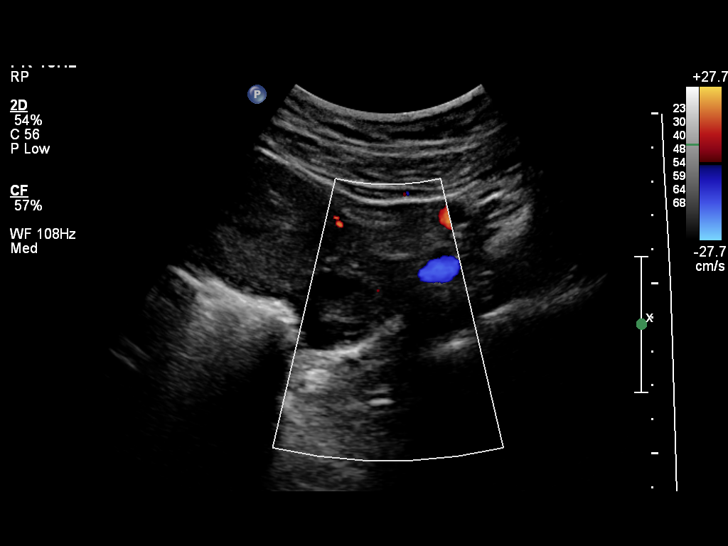
[im 18/45]
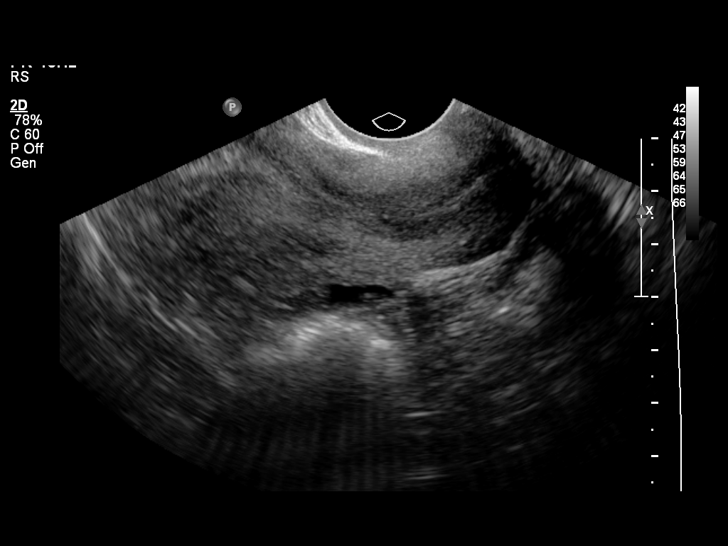
[im 22/45]
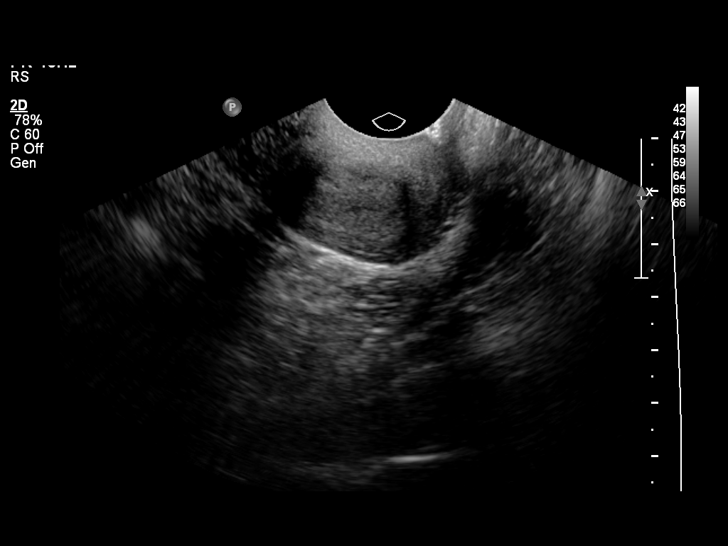
[im 25/45]
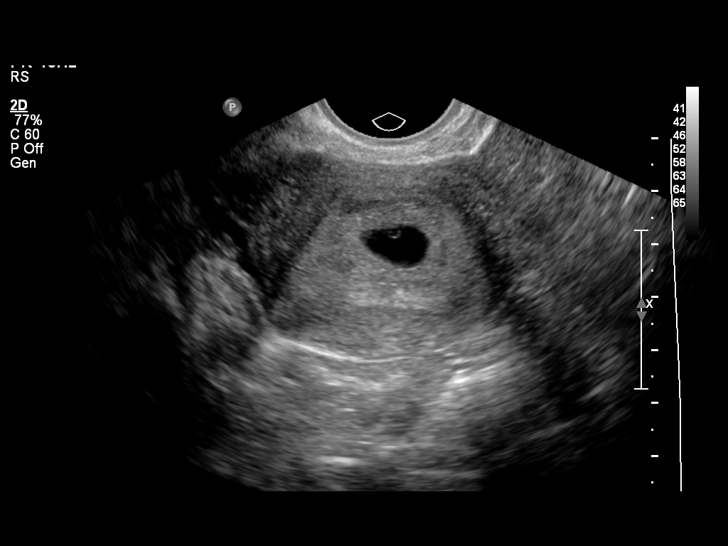
[im 28/45]
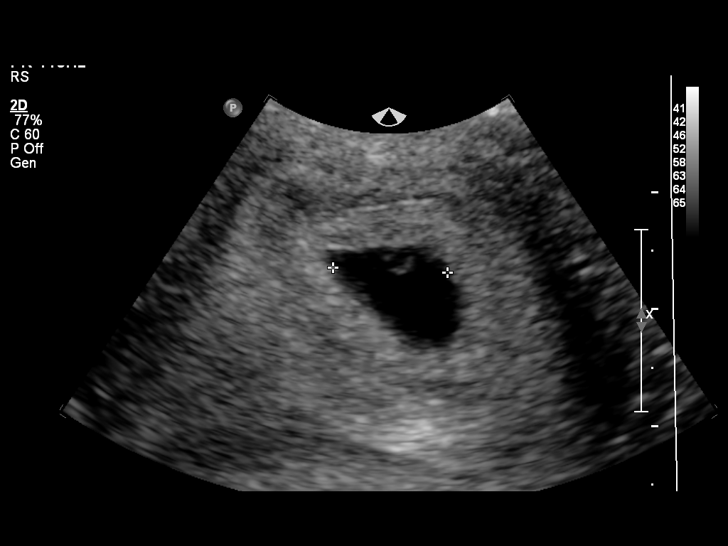
[im 31/45]
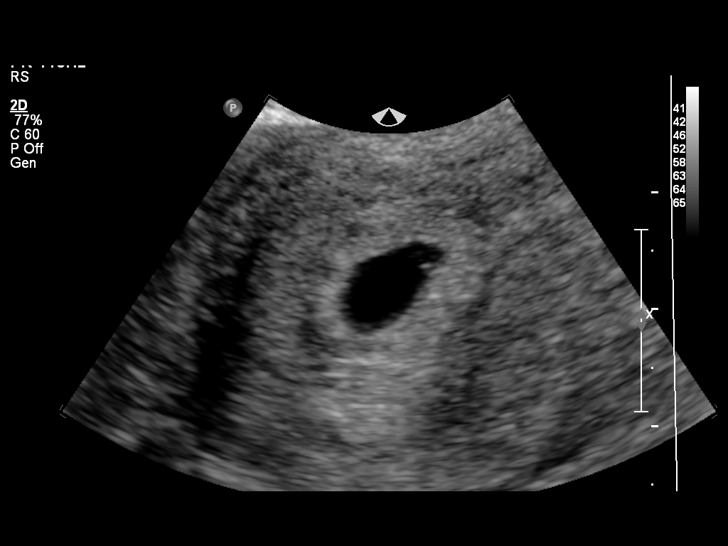
[im 35/45]
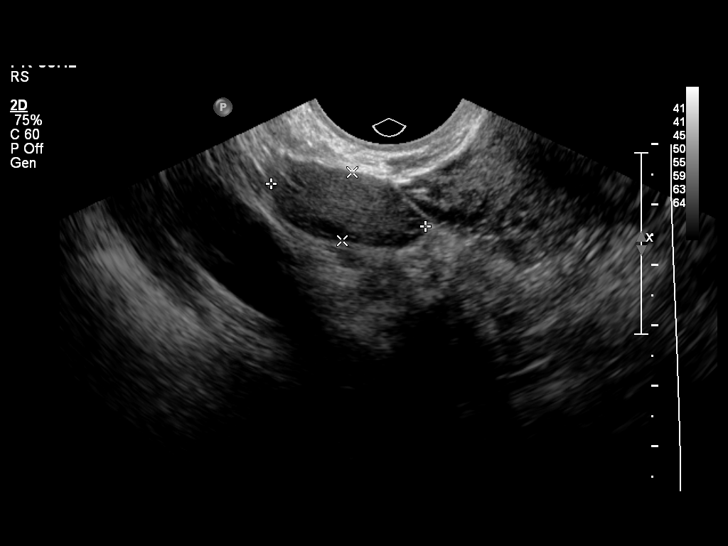
[im 38/45]
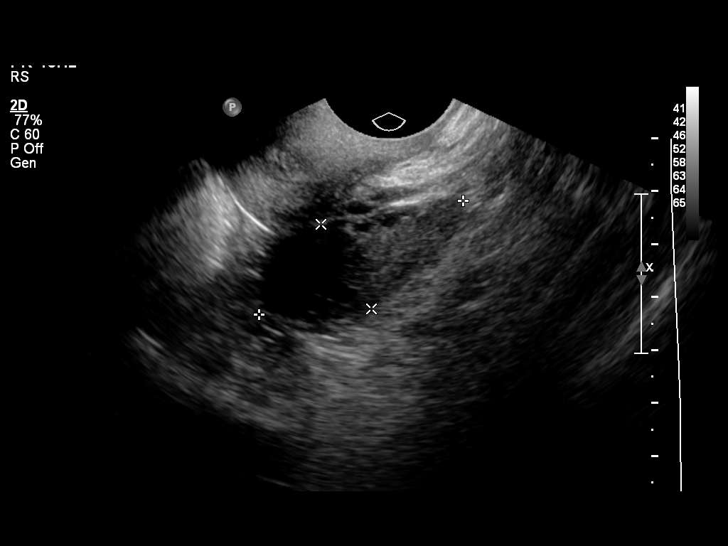
[im 41/45]
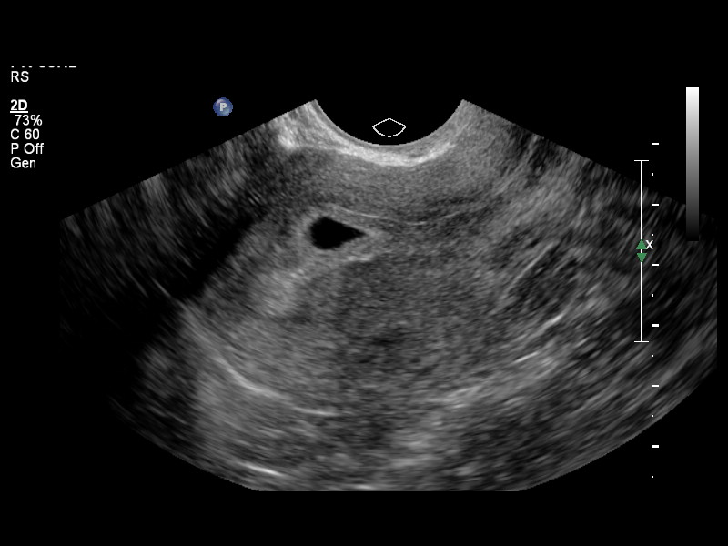
[im 45/45]
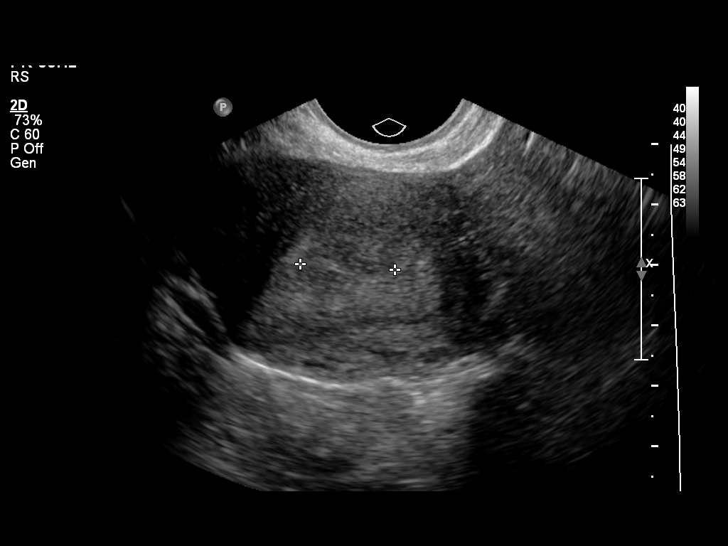

[14 of 28 positions shown; findings below may reference images not displayed]

FINDINGS: Intrauterine gestational sac: Visualized/normal in shape.

Yolk sac:  Visualized.

Embryo:  Not visualized.

Cardiac Activity: Not visualized.

MSD:  7.9   mm   5 w   3  d

US EDC: April 13, 2014.

Maternal uterus/adnexae: Both ovaries appear normal. Probable
subchorionic hemorrhage is noted. Trace free fluid is noted.
IMPRESSION: Probable early intrauterine gestational sac with yolk sac, but no
fetal pole, or cardiac activity yet visualized. Probable
subchorionic hemorrhage noted as well. Recommend follow-up
quantitative B-HCG levels and follow-up US in 14 days to confirm and
assess viability. This recommendation follows SRU consensus
guidelines: Diagnostic Criteria for Nonviable Pregnancy Early in the
First Trimester. N Engl J Med 3601; [DATE].

## 2016-06-12 IMAGING — US US OB TRANSVAGINAL
1 series · 14 of 28 positions shown · non-contrast
Comparison: 08/15/2014

CLINICAL DATA: Pregnant, vaginal bleeding

EXAM:
TRANSVAGINAL OB ULTRASOUND
TECHNIQUE: Transvaginal ultrasound was performed for complete evaluation of the
gestation as well as the maternal uterus, adnexal regions, and
pelvic cul-de-sac.

[Series 1: us ob transvaginal · 32 acquisitions, 14 frames shown]
[im 2/32]
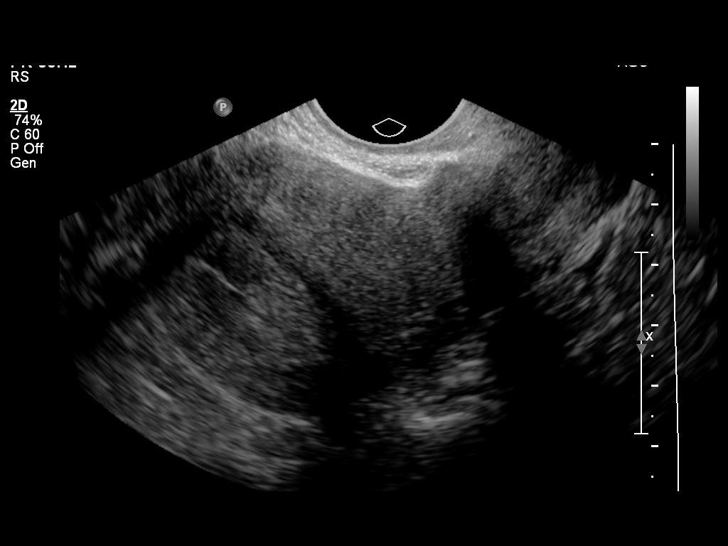
[im 4/32]
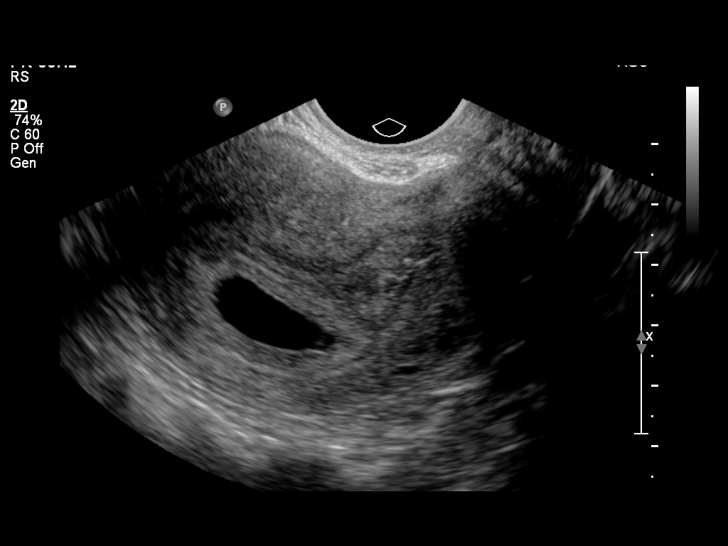
[im 6/32]
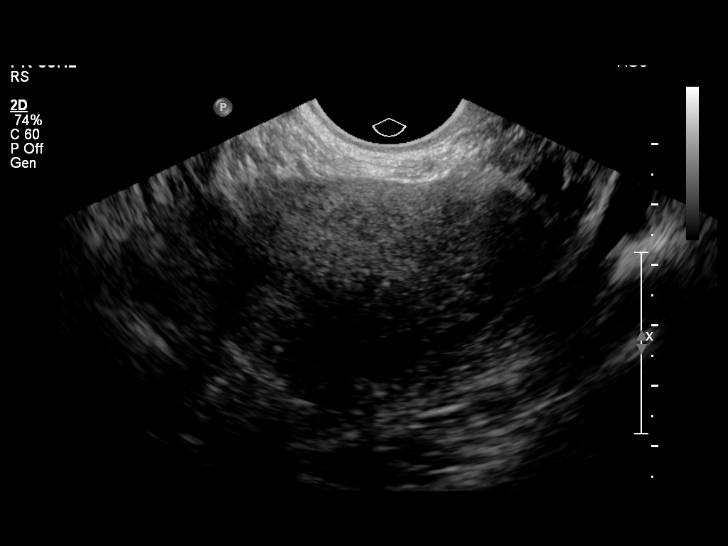
[im 9/32]
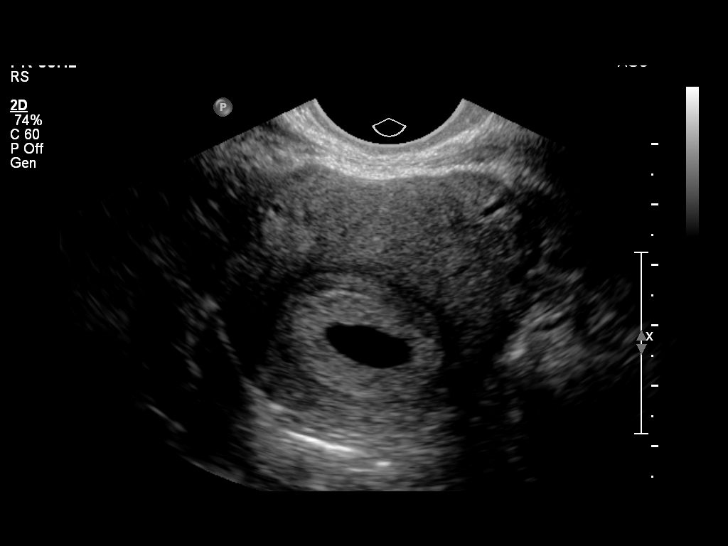
[im 11/32]
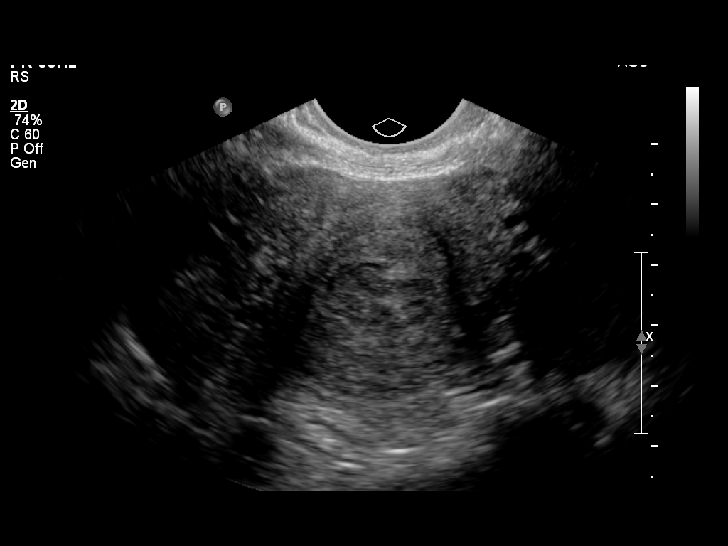
[im 13/32]
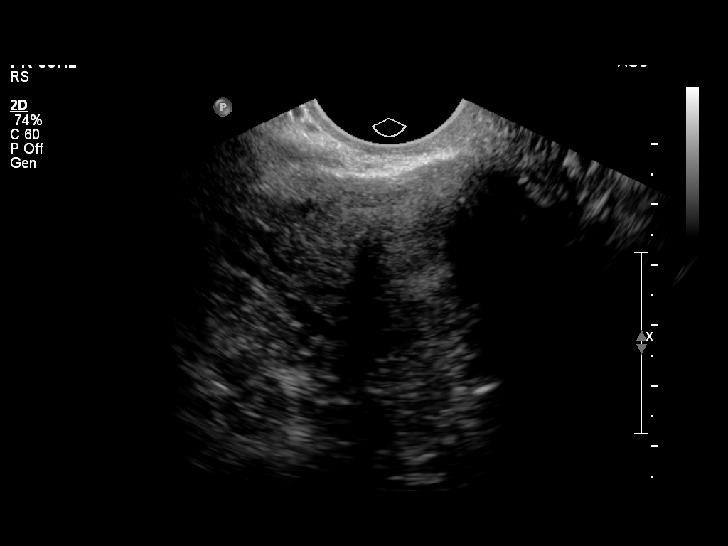
[im 15/32]
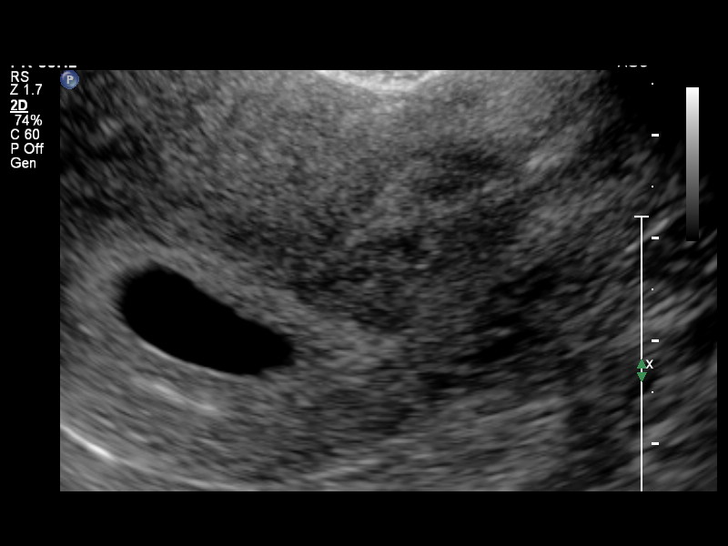
[im 18/32]
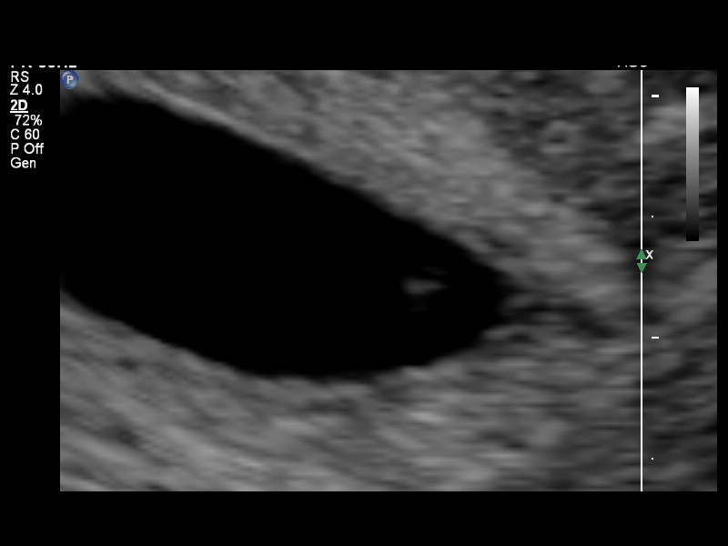
[im 20/32]
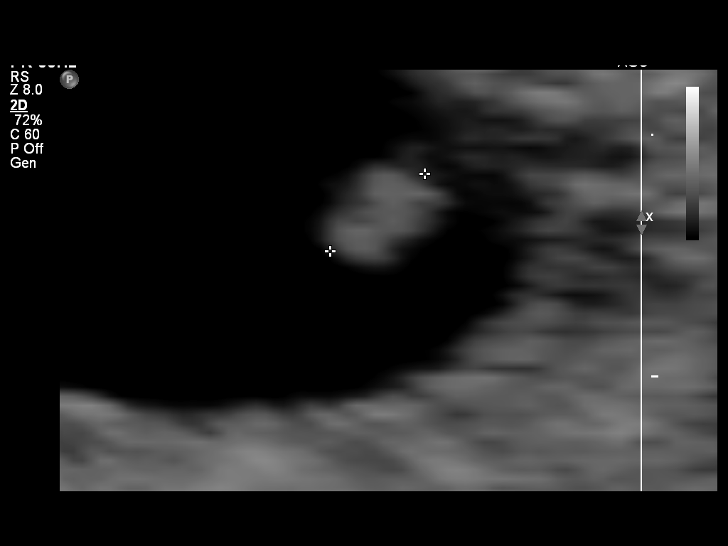
[im 22/32]
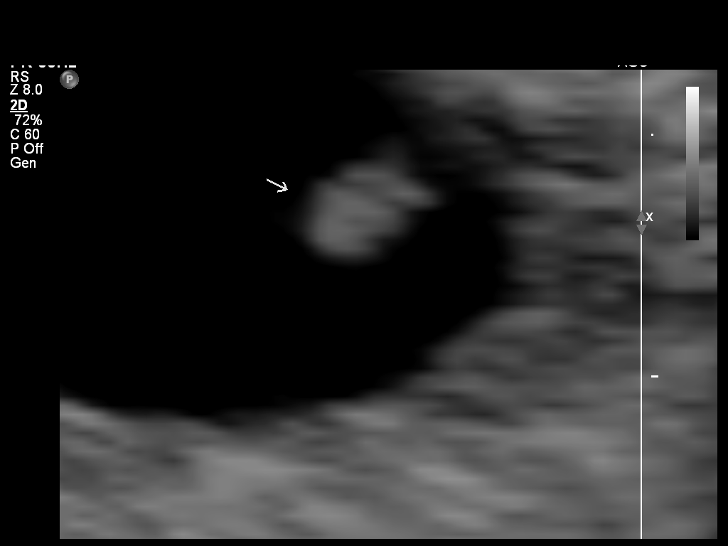
[im 25/32]
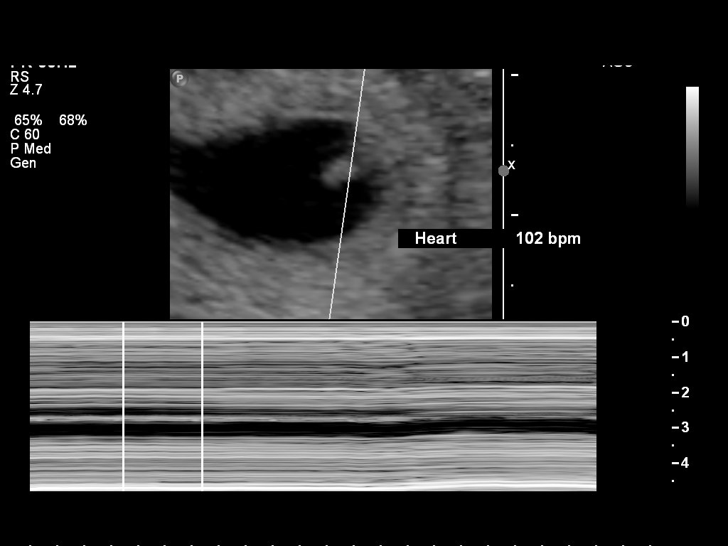
[im 27/32]
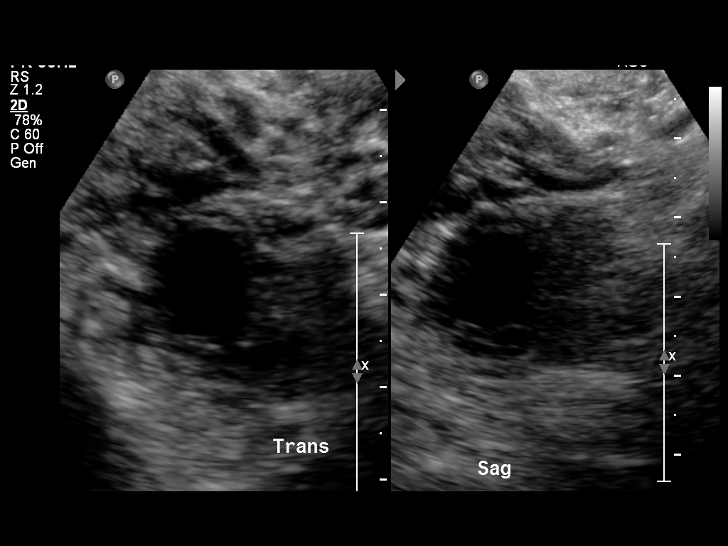
[im 29/32]
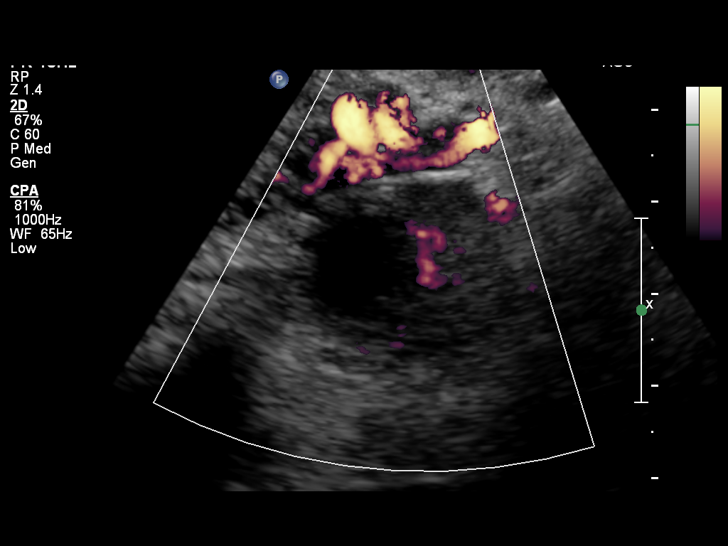
[im 32/32]
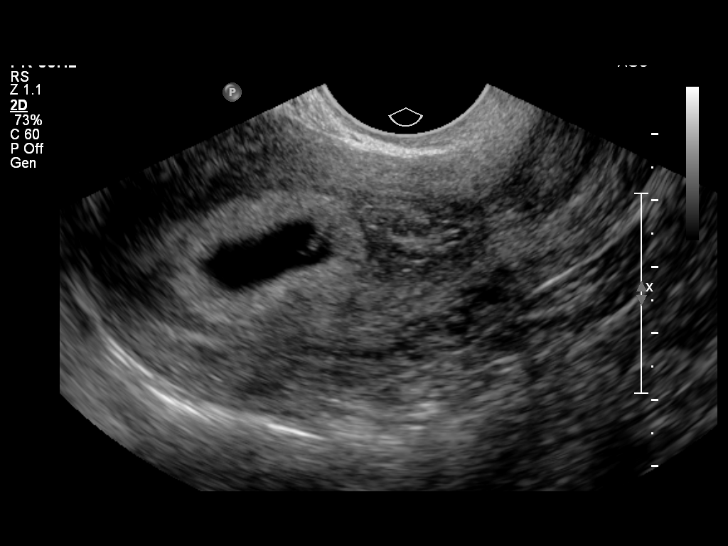

[14 of 28 positions shown; findings below may reference images not displayed]

FINDINGS: Intrauterine gestational sac: Visualized/normal in shape.

Yolk sac:  Present

Embryo:  Present

Cardiac Activity: Present

Heart Rate: 102 bpm

CRL:   2.6  mm   5 w 6 d                  US EDC: 04/16/2015

Maternal uterus/adnexae: Moderate subchronic hemorrhage.

Left ovary is within normal limits, measuring 2.6 x 2.1 x 2.3 cm,
noting a simple corpus luteal cyst.

Right ovary is within normal limits, measuring 2.4 x 1.7 x 1.9 cm.

No free fluid.
IMPRESSION: Single live intrauterine gestation, measuring 5 weeks 6 days by
crown-rump length.

## 2016-09-19 NOTE — L&D Delivery Note (Signed)
Delivery Note At 12:05 AM a viable and healthy female was delivered via Vaginal, Spontaneous Delivery (Presentation:OA ; vtx ).  APGAR: 8, 9; weight pending .   Placenta status: spontaneous ,intact .  Cord:  with the following complications: short .  Cord pH: none  Anesthesia:   Episiotomy: None Lacerations: 2nd degree;Perineal Suture Repair: 3.0 chromic Est. Blood Loss (mL): 200  Mom to postpartum.  Baby to Couplet care / Skin to Skin.  Rebecca Strickland A 05/09/2017, 12:40 AM

## 2016-10-11 LAB — OB RESULTS CONSOLE RUBELLA ANTIBODY, IGM: Rubella: IMMUNE

## 2016-10-11 LAB — OB RESULTS CONSOLE HEPATITIS B SURFACE ANTIGEN: Hepatitis B Surface Ag: NEGATIVE

## 2016-10-11 LAB — OB RESULTS CONSOLE HIV ANTIBODY (ROUTINE TESTING): HIV: NONREACTIVE

## 2016-10-11 LAB — OB RESULTS CONSOLE GC/CHLAMYDIA
Chlamydia: NEGATIVE
GC PROBE AMP, GENITAL: NEGATIVE

## 2016-10-11 LAB — OB RESULTS CONSOLE RPR: RPR: NONREACTIVE

## 2016-10-24 DIAGNOSIS — Z23 Encounter for immunization: Secondary | ICD-10-CM | POA: Diagnosis not present

## 2017-02-20 DIAGNOSIS — J029 Acute pharyngitis, unspecified: Secondary | ICD-10-CM | POA: Diagnosis not present

## 2017-03-14 DIAGNOSIS — Z23 Encounter for immunization: Secondary | ICD-10-CM | POA: Diagnosis not present

## 2017-03-16 ENCOUNTER — Ambulatory Visit (HOSPITAL_COMMUNITY)
Admission: RE | Admit: 2017-03-16 | Discharge: 2017-03-16 | Disposition: A | Discharge status: Home / Self Care | Payer: 59 | Source: Ambulatory Visit | Attending: Obstetrics and Gynecology | Admitting: Obstetrics and Gynecology

## 2017-03-16 ENCOUNTER — Encounter (INDEPENDENT_AMBULATORY_CARE_PROVIDER_SITE_OTHER): Payer: Self-pay

## 2017-03-16 ENCOUNTER — Other Ambulatory Visit (HOSPITAL_COMMUNITY): Payer: Self-pay | Admitting: Obstetrics and Gynecology

## 2017-03-16 DIAGNOSIS — M79662 Pain in left lower leg: Secondary | ICD-10-CM | POA: Diagnosis not present

## 2017-03-16 DIAGNOSIS — O26893 Other specified pregnancy related conditions, third trimester: Secondary | ICD-10-CM | POA: Insufficient documentation

## 2017-03-16 DIAGNOSIS — M79605 Pain in left leg: Secondary | ICD-10-CM

## 2017-03-16 DIAGNOSIS — Z3A30 30 weeks gestation of pregnancy: Secondary | ICD-10-CM | POA: Insufficient documentation

## 2017-03-16 DIAGNOSIS — M7989 Other specified soft tissue disorders: Principal | ICD-10-CM

## 2017-03-16 NOTE — Progress Notes (Signed)
*  PRELIMINARY RESULTS* Vascular Ultrasound Left lower extremity venous duplex has been completed.  Preliminary findings: No evidence of DVT or baker's cyst.     Called results to Dr. Martha ClanAlmquist's office.   Farrel DemarkJill Eunice, RDMS, RVT  03/16/2017, 11:23 AM

## 2017-04-02 ENCOUNTER — Inpatient Hospital Stay (HOSPITAL_COMMUNITY)
Admission: AD | Admit: 2017-04-02 | Discharge: 2017-04-02 | Disposition: A | Discharge status: Home / Self Care | Payer: 59 | Source: Ambulatory Visit | Attending: Obstetrics and Gynecology | Admitting: Obstetrics and Gynecology

## 2017-04-02 ENCOUNTER — Encounter (HOSPITAL_COMMUNITY): Payer: Self-pay | Admitting: *Deleted

## 2017-04-02 DIAGNOSIS — G43009 Migraine without aura, not intractable, without status migrainosus: Secondary | ICD-10-CM

## 2017-04-02 DIAGNOSIS — Z3A32 32 weeks gestation of pregnancy: Secondary | ICD-10-CM | POA: Insufficient documentation

## 2017-04-02 DIAGNOSIS — G43909 Migraine, unspecified, not intractable, without status migrainosus: Secondary | ICD-10-CM | POA: Diagnosis not present

## 2017-04-02 DIAGNOSIS — O99353 Diseases of the nervous system complicating pregnancy, third trimester: Secondary | ICD-10-CM | POA: Insufficient documentation

## 2017-04-02 DIAGNOSIS — Z331 Pregnant state, incidental: Secondary | ICD-10-CM

## 2017-04-02 LAB — URINALYSIS, ROUTINE W REFLEX MICROSCOPIC
BILIRUBIN URINE: NEGATIVE
Glucose, UA: NEGATIVE mg/dL
HGB URINE DIPSTICK: NEGATIVE
Ketones, ur: NEGATIVE mg/dL
NITRITE: NEGATIVE
PH: 7 (ref 5.0–8.0)
Protein, ur: NEGATIVE mg/dL
Specific Gravity, Urine: 1.01 (ref 1.005–1.030)

## 2017-04-02 LAB — URINALYSIS, MICROSCOPIC (REFLEX)

## 2017-04-02 MED ORDER — METOCLOPRAMIDE HCL 5 MG/ML IJ SOLN
10.0000 mg | Freq: Once | INTRAMUSCULAR | Status: AC
  Administered 2017-04-02: 10 mg via INTRAVENOUS
  Filled 2017-04-02: qty 2

## 2017-04-02 MED ORDER — DIPHENHYDRAMINE HCL 50 MG/ML IJ SOLN
25.0000 mg | Freq: Once | INTRAMUSCULAR | Status: AC
  Administered 2017-04-02: 25 mg via INTRAVENOUS
  Filled 2017-04-02: qty 1

## 2017-04-02 MED ORDER — DEXAMETHASONE SODIUM PHOSPHATE 10 MG/ML IJ SOLN
10.0000 mg | Freq: Once | INTRAMUSCULAR | Status: AC
  Administered 2017-04-02: 10 mg via INTRAVENOUS
  Filled 2017-04-02: qty 1

## 2017-04-02 MED ORDER — SODIUM CHLORIDE 0.9 % IV SOLN
INTRAVENOUS | Status: DC
  Administered 2017-04-02: 12:00:00 via INTRAVENOUS

## 2017-04-02 NOTE — MAU Provider Note (Signed)
History     Chief Complaint  Patient presents with  . Headache  . Emesis  . Nausea  . Blurred Vision  26 yo G2P1 MWF @ 32 6/[redacted] week gestation presents with c/o  migraine headache  that started this am. Pt took tylenol but did not take her Fioricet that she has. (+) nausea.. Denies epigastric pain or visual changes   OB History    Gravida Para Term Preterm AB Living   2 1 1     1    SAB TAB Ectopic Multiple Live Births         0 1      Past Medical History:  Diagnosis Date  . Migraine     Past Surgical History:  Procedure Laterality Date  . NO PAST SURGERIES      History reviewed. No pertinent family history.  Social History  Substance Use Topics  . Smoking status: Never Smoker  . Smokeless tobacco: Not on file  . Alcohol use Yes     Comment: occasional    Allergies:  Allergies  Allergen Reactions  . Shellfish Allergy Hives and Nausea And Vomiting  . Latex Rash    Prescriptions Prior to Admission  Medication Sig Dispense Refill Last Dose  . pantoprazole (PROTONIX) 40 MG tablet Take 40 mg by mouth daily.   04/06/2015 at Unknown time  . Prenatal Vit-Fe Fumarate-FA (PRENATAL MULTIVITAMIN) TABS tablet Take 1 tablet by mouth daily at 12 noon.   04/05/2015     Physical Exam   Blood pressure 133/74, pulse 100, temperature 97.7 F (36.5 C), temperature source Oral, resp. rate 18, weight 101.6 kg (224 lb), SpO2 97 %, unknown if currently breastfeeding.  General appearance: alert, cooperative and no distress Abdomen: gravid nontender Extremities: no edema, redness or tenderness in the calves or thighs   Tracing: baseline  140 (+) accel to 160 occ ctx  IMP: Migraine IUP @ 32 6/7 weeks P) Migraine cocktail given. Sx resolved and pt d/c home ED Course   MDM   Marilouise Densmore A, MD 11:22 AM 04/02/2017

## 2017-04-02 NOTE — MAU Note (Signed)
Woke up this am  +migraine Has a hx of migraines; last one in march or April Rating pain 8/10 Tylenol at 9am  +blurred vision +nausea/vomiting Emesis x1 today after drinking pepsi

## 2017-04-02 NOTE — Discharge Instructions (Signed)
Use fioricet for migraine

## 2017-04-11 ENCOUNTER — Inpatient Hospital Stay (HOSPITAL_COMMUNITY)
Admission: AD | Admit: 2017-04-11 | Discharge: 2017-04-11 | Disposition: A | Discharge status: Home / Self Care | Payer: 59 | Source: Ambulatory Visit | Attending: Obstetrics and Gynecology | Admitting: Obstetrics and Gynecology

## 2017-04-11 ENCOUNTER — Encounter (HOSPITAL_COMMUNITY): Payer: Self-pay | Admitting: *Deleted

## 2017-04-11 DIAGNOSIS — G43009 Migraine without aura, not intractable, without status migrainosus: Secondary | ICD-10-CM

## 2017-04-11 DIAGNOSIS — Z3A34 34 weeks gestation of pregnancy: Secondary | ICD-10-CM | POA: Insufficient documentation

## 2017-04-11 DIAGNOSIS — O99113 Other diseases of the blood and blood-forming organs and certain disorders involving the immune mechanism complicating pregnancy, third trimester: Secondary | ICD-10-CM | POA: Insufficient documentation

## 2017-04-11 DIAGNOSIS — G43909 Migraine, unspecified, not intractable, without status migrainosus: Secondary | ICD-10-CM | POA: Insufficient documentation

## 2017-04-11 DIAGNOSIS — D6959 Other secondary thrombocytopenia: Secondary | ICD-10-CM | POA: Insufficient documentation

## 2017-04-11 DIAGNOSIS — O99353 Diseases of the nervous system complicating pregnancy, third trimester: Secondary | ICD-10-CM | POA: Insufficient documentation

## 2017-04-11 DIAGNOSIS — D696 Thrombocytopenia, unspecified: Secondary | ICD-10-CM

## 2017-04-11 LAB — COMPREHENSIVE METABOLIC PANEL
ALBUMIN: 3 g/dL — AB (ref 3.5–5.0)
ALK PHOS: 86 U/L (ref 38–126)
ALT: 18 U/L (ref 14–54)
AST: 24 U/L (ref 15–41)
Anion gap: 9 (ref 5–15)
BUN: 7 mg/dL (ref 6–20)
CALCIUM: 9 mg/dL (ref 8.9–10.3)
CO2: 22 mmol/L (ref 22–32)
CREATININE: 0.52 mg/dL (ref 0.44–1.00)
Chloride: 105 mmol/L (ref 101–111)
GFR calc non Af Amer: >60 mL/min (ref >60)
GLUCOSE: 109 mg/dL — AB (ref 65–99)
Potassium: 3.6 mmol/L (ref 3.5–5.1)
SODIUM: 136 mmol/L (ref 135–145)
Total Bilirubin: 0.7 mg/dL (ref 0.3–1.2)
Total Protein: 6.8 g/dL (ref 6.5–8.1)

## 2017-04-11 LAB — PROTEIN / CREATININE RATIO, URINE
Creatinine, Urine: 84 mg/dL
PROTEIN CREATININE RATIO: 0.11 mg/mg{creat} (ref 0.00–0.15)
Total Protein, Urine: 9 mg/dL

## 2017-04-11 LAB — URIC ACID: Uric Acid, Serum: 4.5 mg/dL (ref 2.3–6.6)

## 2017-04-11 MED ORDER — HYDROCODONE-ACETAMINOPHEN 5-325 MG PO TABS: 1.0000 | ORAL_TABLET | Freq: Four times a day (QID) | ORAL | 0 refills | Status: DC | PRN

## 2017-04-11 NOTE — MAU Provider Note (Signed)
History     Cc: low platelet count HPI: 26 yo G2P1001 MWF @ 34 1/7 weeks with known gestational thrombocytopenia sent to hospital for further testing( r/o PIH/HELLP) due to plt count of 95K today in the office and having complaint of intermittent H/a( known migraines) and BP today 144/80. Pt has had heartburn which intermittent responds to Protonix. (+) light sensitivity with H/a. Pt has been using Fioricet for H/a but that has intermittently been working. (+) weight gain 5lb in 2 weeks. Pt C/O leg swelling yesterday which resolved today   OB History    Gravida Para Term Preterm AB Living   2 1 1     1    SAB TAB Ectopic Multiple Live Births         0 1      Past Medical History:  Diagnosis Date  . Migraine     Past Surgical History:  Procedure Laterality Date  . NO PAST SURGERIES      History reviewed. No pertinent family history.  Social History  Substance Use Topics  . Smoking status: Never Smoker  . Smokeless tobacco: Never Used  . Alcohol use Yes     Comment: occasional    Allergies:  Allergies  Allergen Reactions  . Shellfish Allergy Hives and Nausea And Vomiting  . Latex Rash    Prescriptions Prior to Admission  Medication Sig Dispense Refill Last Dose  . acetaminophen (TYLENOL) 325 MG tablet Take 325 mg by mouth every 6 (six) hours as needed for mild pain, moderate pain, fever or headache.   04/02/2017 at 0900  . Prenatal Vit-Fe Fumarate-FA (PRENATAL MULTIVITAMIN) TABS tablet Take 1 tablet by mouth at bedtime.    04/01/2017 at Unknown time  . sertraline (ZOLOFT) 50 MG tablet Take 50 mg by mouth at bedtime.   04/01/2017 at Unknown time     Physical Exam   Blood pressure 112/64, pulse 97, temperature 98.6 F (37 C), temperature source Oral, resp. rate 18, weight 103.4 kg (228 lb), SpO2 99 %, unknown if currently breastfeeding.  Patient Vitals for the past 24 hrs:  BP Temp Temp src Pulse Resp SpO2 Weight  04/11/17 1745 112/64 - - 97 - - -  04/11/17 1730  114/69 - - (!) 106 - - -  04/11/17 1715 108/63 - - 99 - - -  04/11/17 1700 110/64 - - (!) 101 - - -  04/11/17 1647 113/62 - - 100 - - -  04/11/17 1619 121/72 98.6 F (37 C) Oral (!) 106 18 99 % 103.4 kg (228 lb)  04/11/17 1618 - - - - - 100 % -    No exam performed today, here for labs. seen in office. Tracing: baseline 140 (+) accel 155 one variable with ctx occ ctx  CMP Latest Ref Rng & Units 04/11/2017 04/06/2015 03/20/2015  Glucose 65 - 99 mg/dL 409(W) 99 88  BUN 6 - 20 mg/dL 7 9 5(L)  Creatinine 1.19 - 1.00 mg/dL 1.47 8.29 5.62  Sodium 135 - 145 mmol/L 136 134(L) 135  Potassium 3.5 - 5.1 mmol/L 3.6 4.3 3.9  Chloride 101 - 111 mmol/L 105 110 108  CO2 22 - 32 mmol/L 22 19(L) 21(L)  Calcium 8.9 - 10.3 mg/dL 9.0 1.3(Y) 8.6(V)  Total Protein 6.5 - 8.1 g/dL 6.8 6.3(L) 6.6  Total Bilirubin 0.3 - 1.2 mg/dL 0.7 0.5 0.4  Alkaline Phos 38 - 126 U/L 86 141(H) 113  AST 15 - 41 U/L 24 28 24   ALT 14 -  54 U/L 18 24 19   protein/creatinine ratio: 0.11 ED Course  IMP: Gestational thrombocytopenia in third trimester Migraine IUP @ 34 1/7 weeks  P) d/c home. Heme/onc referral for plt ct mgmt. Keep sched OB appt,  PIH warning signs. vicodin for h/a mgmt. Pt instructed to increase protonix for bid MDM   Tristin Gladman A, MD 6:02 PM 04/11/2017

## 2017-04-11 NOTE — MAU Note (Signed)
Sent from office with elevated BP.  Has pretty much has a HA since she was here last Sunday.doens't take the Fioricet everyday.  +HA today, no visual changes,

## 2017-04-11 NOTE — Discharge Instructions (Signed)
Preeclampsia warning signs.  Heme/onc referral will done at office

## 2017-04-13 ENCOUNTER — Encounter: Payer: Self-pay | Admitting: Hematology

## 2017-04-13 ENCOUNTER — Encounter: Payer: Self-pay | Admitting: Hematology and Oncology

## 2017-04-13 ENCOUNTER — Telehealth: Payer: Self-pay | Admitting: Hematology and Oncology

## 2017-04-13 ENCOUNTER — Telehealth: Payer: Self-pay | Admitting: Hematology

## 2017-04-13 NOTE — Telephone Encounter (Signed)
Pt appt has been rescheduled to see Dr. Candise CheKale on 7/30 at 11am. New letter mailed to the pt and faxed to the referring.

## 2017-04-13 NOTE — Telephone Encounter (Signed)
Appt has been scheduled for the pt to see Dr. Pamelia HoitGudena on 8/3 at [facility]9am. Address and insurance verified. Letter mailed to the pt and faxed to the referring.

## 2017-04-17 ENCOUNTER — Encounter: Payer: Self-pay | Admitting: Hematology

## 2017-04-17 ENCOUNTER — Ambulatory Visit (HOSPITAL_BASED_OUTPATIENT_CLINIC_OR_DEPARTMENT_OTHER): Payer: 59 | Admitting: Hematology

## 2017-04-17 ENCOUNTER — Telehealth: Payer: Self-pay

## 2017-04-17 ENCOUNTER — Ambulatory Visit (HOSPITAL_BASED_OUTPATIENT_CLINIC_OR_DEPARTMENT_OTHER): Payer: 59

## 2017-04-17 VITALS — BP 116/67 | HR 90 | Temp 98.9°F | Resp 18 | Ht 63.0 in | Wt 227.4 lb

## 2017-04-17 DIAGNOSIS — O99119 Other diseases of the blood and blood-forming organs and certain disorders involving the immune mechanism complicating pregnancy, unspecified trimester: Principal | ICD-10-CM

## 2017-04-17 DIAGNOSIS — D696 Thrombocytopenia, unspecified: Secondary | ICD-10-CM | POA: Diagnosis not present

## 2017-04-17 LAB — COMPREHENSIVE METABOLIC PANEL
ALBUMIN: 3 g/dL — AB (ref 3.5–5.0)
ALT: 17 U/L (ref 0–55)
ANION GAP: 9 meq/L (ref 3–11)
AST: 18 U/L (ref 5–34)
Alkaline Phosphatase: 103 U/L (ref 40–150)
BUN: 6.8 mg/dL — ABNORMAL LOW (ref 7.0–26.0)
CALCIUM: 9.7 mg/dL (ref 8.4–10.4)
CHLORIDE: 107 meq/L (ref 98–109)
CO2: 21 mEq/L — ABNORMAL LOW (ref 22–29)
CREATININE: 0.6 mg/dL (ref 0.6–1.1)
Glucose: 88 mg/dl (ref 70–140)
POTASSIUM: 3.5 meq/L (ref 3.5–5.1)
Sodium: 138 mEq/L (ref 136–145)
TOTAL PROTEIN: 6.7 g/dL (ref 6.4–8.3)
Total Bilirubin: 0.38 mg/dL (ref 0.20–1.20)

## 2017-04-17 LAB — CBC & DIFF AND RETIC
BASO%: 0 % (ref 0.0–2.0)
BASOS ABS: 0 10*3/uL (ref 0.0–0.1)
EOS%: 0.6 % (ref 0.0–7.0)
Eosinophils Absolute: 0.1 10*3/uL (ref 0.0–0.5)
HEMATOCRIT: 39.5 % (ref 34.8–46.6)
HGB: 13.4 g/dL (ref 11.6–15.9)
IMMATURE RETIC FRACT: 10.5 % — AB (ref 1.60–10.00)
LYMPH%: 15.3 % (ref 14.0–49.7)
MCH: 31.3 pg (ref 25.1–34.0)
MCHC: 33.9 g/dL (ref 31.5–36.0)
MCV: 92.3 fL (ref 79.5–101.0)
MONO#: 0.7 10*3/uL (ref 0.1–0.9)
MONO%: 7.9 % (ref 0.0–14.0)
NEUT#: 6.4 10*3/uL (ref 1.5–6.5)
NEUT%: 76.2 % (ref 38.4–76.8)
RBC: 4.28 10*6/uL (ref 3.70–5.45)
RDW: 14.4 % (ref 11.2–14.5)
Retic %: 2.59 % — ABNORMAL HIGH (ref 0.70–2.10)
Retic Ct Abs: 110.85 10*3/uL — ABNORMAL HIGH (ref 33.70–90.70)
WBC: 8.4 10*3/uL (ref 3.9–10.3)
lymph#: 1.3 10*3/uL (ref 0.9–3.3)

## 2017-04-17 LAB — CHCC SMEAR

## 2017-04-17 LAB — LACTATE DEHYDROGENASE: LDH: 86 U/L — AB (ref 125–245)

## 2017-04-17 NOTE — Patient Instructions (Signed)
Thank you for choosing [facility] to provide your oncology and hematology care.  To afford each patient quality time with our providers, please arrive 30 minutes before your scheduled appointment time.  If you arrive late for your appointment, you may be asked to reschedule.  We strive to give you quality time with our providers, and arriving late affects you and other patients whose appointments are after yours.  If you are a no show for multiple scheduled visits, you may be dismissed from the clinic at the providers discretion.   Again, thank you for choosing [facility], our hope is that these requests will decrease the amount of time that you wait before being seen by our physicians.  ______________________________________________________________________ Should you have questions after your visit to the  Cancer Center, please contact our office at [phone] between the hours of 8:30 and 4:30 p.m.    Voicemails left after 4:30p.m will not be returned until the following business day.   For prescription refill requests, please have your pharmacy contact us directly.  Please also try to allow 48 hours for prescription requests.   Please contact the scheduling department for questions regarding scheduling.  For scheduling of procedures such as PET scans, CT scans, MRI, Ultrasound, etc please contact central scheduling at [phone].   Resources For Cancer Patients and Caregivers:  American Cancer Society:  800-227-2345  Can help patients locate various types of support and financial assistance Cancer Care: 1-800-813-HOPE (4673) Provides financial assistance, online support groups, medication/co-pay assistance.   [place] DSS:  336-641-3447 Where to apply for food stamps, Medicaid, and utility assistance Medicare Rights Center: 800-333-4114 Helps people with Medicare understand their rights and benefits, navigate the Medicare system, and secure the  quality healthcare they deserve SCAT: 336-333-6589 [employer] [employer]'s shared-ride transportation service for eligible riders who have a disability that prevents them from riding the fixed route bus.   For additional information on assistance programs please contact our social worker:   Grier Hock/Abigail Elmore:  [phone] 

## 2017-04-17 NOTE — Telephone Encounter (Signed)
appts made and avs was pritned for patient

## 2017-04-17 NOTE — Progress Notes (Signed)
Marland Kitchen    HEMATOLOGY/ONCOLOGY CONSULTATION NOTE  Date of Service: 04/17/2017  Patient Care Team: Patient, No Pcp Per as PCP - General (General Practice) Ob-Gyn - Dr Nena Jordan A. Cousins MD  CHIEF COMPLAINTS/PURPOSE OF CONSULTATION:  Thrombocytopenia in Pregnancy  HISTORY OF PRESENTING ILLNESS:  Rebecca Strickland is a wonderful 26 y.o. female who has been referred to [place] by  Dr Nena Jordan A. Cousins MD for evaluation and management of thrombocytopenia in pregnancy.  Patient notes that she had mild thrombocytopenia during the third trimester of her first pregnancy about 2 years ago. She had normal platelet counts and her first trimester and her platelet counts dipped to just under 150k by 30th week and was at [facility] when she had an induced vaginal delivery at 39 weeks. Patient had an uneventful epidural an uneventful labor with no issues with excessive bleeding or postpartum bleeding. Patient does not know if her platelet counts are completely normalized after delivery but this is likely the case. Patient had no preeclampsia, no acute fatty liver pregnancy, no HELLP syndrome etc.   During her current pregnancy she notes that her platelet counts were within normal limits during her first trimester. She notes that during her late second trimester her platelet counts dropped to 119k and on recent labs on 04/11/2017 and 34 weeks the platelet counts were noted to be 95k. No issues with bleeding. No hypertension no evidence of proteinuria no evidence of abnormal liver function tests. Patient notes that she has been feeling well overall.  No significant use of NSAIDs over-the-counter. Does use protonix as needed for acid reflux. Notes that her migraine headaches have been worse in the third trimester and she is using Vicodin as needed for this.  Repeat labs in clinic today at 35 weeks pregnancy show platelet counts better improved at 112k. With normal hemoglobin of 13.4 MCV is 92.3 and normal WBC count of  8.4k. CMP is within normal limits with normal LFTs. LDH is not elevated and this suggests against any evidence of hemolysis.   MEDICAL HISTORY:  Past Medical History:  Diagnosis Date  . Migraine   History of chickenpox History of gestational thrombocytopenia with her first pregnancy with platelet counts at 39 weeks 90k. GERD Depression G2 P1 L1. Patient has a 29-year-old son  SURGICAL HISTORY: Past Surgical History:  Procedure Laterality Date  . NO PAST SURGERIES      SOCIAL HISTORY: Social History   Social History  . Marital status: Married    Spouse name: N/A  . Number of children: N/A  . Years of education: N/A   Occupational History  . Not on file.   Social History Main Topics  . Smoking status: Never Smoker  . Smokeless tobacco: Never Used  . Alcohol use Yes     Comment: occasional  . Drug use: No  . Sexual activity: Yes    Birth control/ protection: None   Other Topics Concern  . Not on file   Social History Narrative  . No narrative on file  Patient works at [employer].  FAMILY HISTORY: History reviewed. No pertinent family history. No family history of blood disorders  ALLERGIES:  is allergic to bee venom; shellfish allergy; and latex.  MEDICATIONS:  Current Outpatient Prescriptions  Medication Sig Dispense Refill  . acetaminophen (TYLENOL) 325 MG tablet Take 325 mg by mouth every 6 (six) hours as needed for mild pain, moderate pain, fever or headache.    Marland Kitchen HYDROcodone-acetaminophen (NORCO/VICODIN) 5-325 MG tablet Take 1-2 tablets  by mouth every 6 (six) hours as needed (migraine). 15 tablet 0  . Prenatal Vit-Fe Fumarate-FA (PRENATAL MULTIVITAMIN) TABS tablet Take 1 tablet by mouth at bedtime.     . sertraline (ZOLOFT) 50 MG tablet Take 50 mg by mouth at bedtime.     No current facility-administered medications for this visit.     REVIEW OF SYSTEMS:    10 Point review of Systems was done is negative except as noted above.  PHYSICAL  EXAMINATION: ECOG PERFORMANCE STATUS: 0 - Asymptomatic  . Vitals:   04/17/17 1109  BP: 116/67  Pulse: 90  Resp: 18  Temp: 98.9 F (37.2 C)   Filed Weights   04/17/17 1109  Weight: 227 lb 6.4 oz (103.1 kg)   .Body mass index is 40.28 kg/m.  GENERAL:alert, in no acute distress and comfortable SKIN: no acute rashes, no significant lesions EYES: conjunctiva are pink and non-injected, sclera anicteric OROPHARYNX: MMM, no exudates, no oropharyngeal erythema or ulceration NECK: supple, no JVD LYMPH:  no palpable lymphadenopathy in the cervical, axillary or inguinal regions LUNGS: clear to auscultation b/l with normal respiratory effort HEART: regular rate & rhythm ABDOMEN:  normoactive bowel sounds , non tender, not distended.Gravid uterus. No palpable hepatosplenomegaly Extremity: no pedal edema PSYCH: alert & oriented x 3 with fluent speech NEURO: no focal motor/sensory deficits  LABORATORY DATA:  I have reviewed the data as listed  . CBC Latest Ref Rng & Units 04/17/2017 04/09/2015 04/08/2015  WBC 3.9 - 10.3 10e3/uL 8.4 14.8(H) 16.5(H)  Hemoglobin 11.6 - 15.9 g/dL 16.113.4 10.3(L) 10.0(L)  Hematocrit 34.8 - 46.6 % 39.5 31.3(L) 30.0(L)  Platelets 145 - 400 10e3/uL 112 Large platelets present(L) 107(L) 85(L)    . CMP Latest Ref Rng & Units 04/17/2017 04/11/2017 04/06/2015  Glucose 70 - 140 mg/dl 88 096(E109(H) 99  BUN 7.0 - 26.0 mg/dL 4.5(W6.8(L) 7 9  Creatinine 0.6 - 1.1 mg/dL 0.6 0.980.52 1.190.54  Sodium 147136 - 145 mEq/L 138 136 134(L)  Potassium 3.5 - 5.1 mEq/L 3.5 3.6 4.3  Chloride 101 - 111 mmol/L - 105 110  CO2 22 - 29 mEq/L 21(L) 22 19(L)  Calcium 8.4 - 10.4 mg/dL 9.7 9.0 8.2(N8.5(L)  Total Protein 6.4 - 8.3 g/dL 6.7 6.8 6.3(L)  Total Bilirubin 0.20 - 1.20 mg/dL 5.620.38 0.7 0.5  Alkaline Phos 40 - 150 U/L 103 86 141(H)  AST 5 - 34 U/L 18 24 28   ALT 0 - 55 U/L 17 18 24      RADIOGRAPHIC STUDIES: I have personally reviewed the radiological images as listed and agreed with the findings in the  report. No results found.  ASSESSMENT & PLAN:   26 year old Caucasian female with   #1 Mild thrombocytopenia platelet counts of 112k and [redacted] weeks gestation. Absence of thrombocytopenia and nonpregnant state and during the first trimester makes the likelihood of ITP low. Her presentation currently is consistent with gestational thrombocytopenia. No evidence of hypertension, proteinuria, abnormal LFTs to suggest any other pregnancy related complications. Plan -Possible etiologies of thrombocytopenia and the likely diagnosis of gestational thrombocytopenia were discussed in details with the patient. -At this point the patient's counts are stable and in keeping with pregnancy related mild decrease in platelet counts. -We discussed minimizing over-the-counter NSAIDs, PPI and H2 blocker use to minimize risk so thrombocytopenia related to medications. -Patient will continue using her prenatal vitamins. -No acute intervention for mild thrombocytopenia indicated at this time -Would recommend repeat CBC with her OB physician in 2 weeks. -Return to clinic with  us in 4 weeks with repeat labs to monitor platelet counts. -If the platelet counts were to drop to less than 70k will need to consider possible low-dose prednisone trial or IVIG to rule out immune component though this is less likely.  Labs today RTC with Dr Candise CheKale with labs in 4 weeks  All of the patients questions were answered with apparent satisfaction. The patient knows to call the clinic with any problems, questions or concerns.  I spent 40 minutes counseling the patient face to face. The total time spent in the appointment was 50 minutes and more than 50% was on counseling and direct patient cares.    Wyvonnia LoraGautam Kale MD MS AAHIVMS [facility] Hematology/Oncology Physician [facility]  (Office):       [phone] (Work cell):  [phone] (Fax):           [phone]  04/17/2017 11:08 PM

## 2017-04-18 LAB — OB RESULTS CONSOLE GBS: STREP GROUP B AG: NEGATIVE

## 2017-04-19 ENCOUNTER — Telehealth: Payer: Self-pay | Admitting: *Deleted

## 2017-04-19 NOTE — Telephone Encounter (Signed)
Called pt per Dr Clyda GreenerKale's message & informed of increase in platelets today & rest of labs non concerning.  Pt expressed understanding & appreciation of call.

## 2017-04-21 ENCOUNTER — Encounter: Payer: 59 | Admitting: Hematology and Oncology

## 2017-05-08 ENCOUNTER — Encounter (HOSPITAL_COMMUNITY): Payer: Self-pay | Admitting: *Deleted

## 2017-05-08 ENCOUNTER — Inpatient Hospital Stay (HOSPITAL_COMMUNITY)
Admission: AD | Admit: 2017-05-08 | Discharge: 2017-05-10 | DRG: 775 | Disposition: A | Discharge status: Home / Self Care | Payer: 59 | Source: Ambulatory Visit | Attending: Obstetrics and Gynecology | Admitting: Obstetrics and Gynecology

## 2017-05-08 ENCOUNTER — Inpatient Hospital Stay (HOSPITAL_COMMUNITY): Payer: 59 | Admitting: Anesthesiology

## 2017-05-08 DIAGNOSIS — O99119 Other diseases of the blood and blood-forming organs and certain disorders involving the immune mechanism complicating pregnancy, unspecified trimester: Secondary | ICD-10-CM | POA: Diagnosis present

## 2017-05-08 DIAGNOSIS — O878 Other venous complications in the puerperium: Secondary | ICD-10-CM | POA: Diagnosis present

## 2017-05-08 DIAGNOSIS — F419 Anxiety disorder, unspecified: Secondary | ICD-10-CM | POA: Diagnosis present

## 2017-05-08 DIAGNOSIS — O99344 Other mental disorders complicating childbirth: Secondary | ICD-10-CM | POA: Diagnosis present

## 2017-05-08 DIAGNOSIS — Z6841 Body Mass Index (BMI) 40.0 and over, adult: Secondary | ICD-10-CM

## 2017-05-08 DIAGNOSIS — Z3A38 38 weeks gestation of pregnancy: Secondary | ICD-10-CM

## 2017-05-08 DIAGNOSIS — F329 Major depressive disorder, single episode, unspecified: Secondary | ICD-10-CM | POA: Diagnosis present

## 2017-05-08 DIAGNOSIS — O99214 Obesity complicating childbirth: Secondary | ICD-10-CM | POA: Diagnosis present

## 2017-05-08 DIAGNOSIS — O9912 Other diseases of the blood and blood-forming organs and certain disorders involving the immune mechanism complicating childbirth: Secondary | ICD-10-CM | POA: Diagnosis not present

## 2017-05-08 DIAGNOSIS — D693 Immune thrombocytopenic purpura: Secondary | ICD-10-CM | POA: Diagnosis not present

## 2017-05-08 DIAGNOSIS — D696 Thrombocytopenia, unspecified: Secondary | ICD-10-CM | POA: Diagnosis present

## 2017-05-08 DIAGNOSIS — D6959 Other secondary thrombocytopenia: Secondary | ICD-10-CM | POA: Diagnosis present

## 2017-05-08 DIAGNOSIS — G43009 Migraine without aura, not intractable, without status migrainosus: Secondary | ICD-10-CM

## 2017-05-08 LAB — CBC
HCT: 39.3 % (ref 36.0–46.0)
HEMOGLOBIN: 13.5 g/dL (ref 12.0–15.0)
MCH: 31.2 pg (ref 26.0–34.0)
MCHC: 34.4 g/dL (ref 30.0–36.0)
MCV: 90.8 fL (ref 78.0–100.0)
Platelets: 98 10*3/uL — ABNORMAL LOW (ref 150–400)
RBC: 4.33 MIL/uL (ref 3.87–5.11)
RDW: 14.8 % (ref 11.5–15.5)
WBC: 6.8 10*3/uL (ref 4.0–10.5)

## 2017-05-08 LAB — TYPE AND SCREEN
ABO/RH(D): O POS
Antibody Screen: NEGATIVE

## 2017-05-08 MED ORDER — LIDOCAINE HCL (PF) 1 % IJ SOLN
30.0000 mL | INTRAMUSCULAR | Status: DC | PRN
  Filled 2017-05-08: qty 30

## 2017-05-08 MED ORDER — OXYTOCIN 40 UNITS IN LACTATED RINGERS INFUSION - SIMPLE MED
2.5000 [IU]/h | INTRAVENOUS | Status: DC
  Administered 2017-05-09: 2.5 [IU]/h via INTRAVENOUS
  Filled 2017-05-08: qty 1000

## 2017-05-08 MED ORDER — FENTANYL 2.5 MCG/ML BUPIVACAINE 1/10 % EPIDURAL INFUSION (WH - ANES)
14.0000 mL/h | INTRAMUSCULAR | Status: DC | PRN
  Administered 2017-05-08 (×3): 14 mL/h via EPIDURAL
  Filled 2017-05-08 (×2): qty 100

## 2017-05-08 MED ORDER — OXYCODONE-ACETAMINOPHEN 5-325 MG PO TABS: 2.0000 | ORAL_TABLET | ORAL | Status: DC | PRN

## 2017-05-08 MED ORDER — EPHEDRINE 5 MG/ML INJ
10.0000 mg | INTRAVENOUS | Status: DC | PRN
  Filled 2017-05-08: qty 2

## 2017-05-08 MED ORDER — PHENYLEPHRINE 40 MCG/ML (10ML) SYRINGE FOR IV PUSH (FOR BLOOD PRESSURE SUPPORT)
80.0000 ug | PREFILLED_SYRINGE | INTRAVENOUS | Status: DC | PRN
  Filled 2017-05-08: qty 5

## 2017-05-08 MED ORDER — TERBUTALINE SULFATE 1 MG/ML IJ SOLN
0.2500 mg | Freq: Once | INTRAMUSCULAR | Status: DC | PRN
  Filled 2017-05-08: qty 1

## 2017-05-08 MED ORDER — OXYTOCIN BOLUS FROM INFUSION
500.0000 mL | Freq: Once | INTRAVENOUS | Status: AC
  Administered 2017-05-09: 500 mL via INTRAVENOUS

## 2017-05-08 MED ORDER — PHENYLEPHRINE 40 MCG/ML (10ML) SYRINGE FOR IV PUSH (FOR BLOOD PRESSURE SUPPORT)
80.0000 ug | PREFILLED_SYRINGE | INTRAVENOUS | Status: DC | PRN
  Filled 2017-05-08: qty 5
  Filled 2017-05-08: qty 10

## 2017-05-08 MED ORDER — OXYCODONE-ACETAMINOPHEN 5-325 MG PO TABS: 1.0000 | ORAL_TABLET | ORAL | Status: DC | PRN

## 2017-05-08 MED ORDER — DIPHENHYDRAMINE HCL 50 MG/ML IJ SOLN: 12.5000 mg | INTRAMUSCULAR | Status: DC | PRN

## 2017-05-08 MED ORDER — ONDANSETRON HCL 4 MG/2ML IJ SOLN
4.0000 mg | Freq: Four times a day (QID) | INTRAMUSCULAR | Status: DC | PRN
  Administered 2017-05-08: 4 mg via INTRAVENOUS
  Filled 2017-05-08: qty 2

## 2017-05-08 MED ORDER — LACTATED RINGERS IV SOLN
INTRAVENOUS | Status: DC
  Administered 2017-05-08: 22:00:00 via INTRAUTERINE

## 2017-05-08 MED ORDER — ACETAMINOPHEN 325 MG PO TABS: 650.0000 mg | ORAL_TABLET | ORAL | Status: DC | PRN

## 2017-05-08 MED ORDER — LACTATED RINGERS IV SOLN
500.0000 mL | INTRAVENOUS | Status: DC | PRN
  Administered 2017-05-08: 500 mL via INTRAVENOUS
  Administered 2017-05-08: 250 mL via INTRAVENOUS

## 2017-05-08 MED ORDER — SOD CITRATE-CITRIC ACID 500-334 MG/5ML PO SOLN: 30.0000 mL | ORAL | Status: DC | PRN

## 2017-05-08 MED ORDER — LACTATED RINGERS IV SOLN
INTRAVENOUS | Status: DC
  Administered 2017-05-08 (×2): via INTRAVENOUS

## 2017-05-08 MED ORDER — LIDOCAINE HCL (PF) 1 % IJ SOLN
INTRAMUSCULAR | Status: DC | PRN
  Administered 2017-05-08 (×2): 5 mL via EPIDURAL

## 2017-05-08 MED ORDER — OXYTOCIN 40 UNITS IN LACTATED RINGERS INFUSION - SIMPLE MED
1.0000 m[IU]/min | INTRAVENOUS | Status: DC
  Administered 2017-05-08: 2 m[IU]/min via INTRAVENOUS

## 2017-05-08 MED ORDER — LACTATED RINGERS IV SOLN
500.0000 mL | Freq: Once | INTRAVENOUS | Status: AC
  Administered 2017-05-08: 500 mL via INTRAVENOUS

## 2017-05-08 NOTE — Progress Notes (Addendum)
S: c/o rectal pressure  O: BP 107/60   Pulse 79   Temp 98.1 F (36.7 C) (Oral)   Resp 18   Ht 5\' 3"  (1.6 m)   Wt 104.3 kg (230 lb)   SpO2 100%   BMI 40.74 kg/m   Pitocin off VE fully (+) 1 station Amnioinfusion done for variables noted earlier Tracing baseline 120 (+) variables (+) accel Ctx q 2-3 mins  IMP: complete Gestational thrombocytopenia P) start pushing

## 2017-05-08 NOTE — Anesthesia Procedure Notes (Addendum)
Epidural Patient location during procedure: OB Start time: 05/08/2017 2:34 PM End time: 05/08/2017 2:37 PM  Staffing Anesthesiologist: Leilani Able Performed: anesthesiologist   Preanesthetic Checklist Completed: patient identified, site marked, surgical consent, pre-op evaluation, timeout performed, IV checked, risks and benefits discussed and monitors and equipment checked  Epidural Patient position: sitting Prep: site prepped and draped and DuraPrep Patient monitoring: continuous pulse ox and blood pressure Approach: midline Location: L3-L4 Injection technique: LOR air  Needle:  Needle type: Tuohy  Needle gauge: 17 G Needle length: 9 cm and 9 Needle insertion depth: 5 cm cm Catheter type: closed end flexible Catheter size: 19 Gauge Catheter at skin depth: 10 cm Test dose: negative  Assessment Events: blood not aspirated, injection not painful, no injection resistance, negative IV test and no paresthesia  Additional Notes Reason for block:procedure for pain

## 2017-05-08 NOTE — H&P (Addendum)
Rebecca Strickland is a 26 y.o. female presenting @ 38 weeks for IOl 2nd to plt ct 87K( gestational thrombocytopenia) Pt has seen heme/onc and was previously r/io for HELLP syndrome. sono today showed 8lb 5 oz, nl fluid. BMZ complete 7/24, 7/25  OB History    Gravida Para Term Preterm AB Living   2 1 1     1    SAB TAB Ectopic Multiple Live Births         0 1     Past Medical History:  Diagnosis Date  . Migraine    Past Surgical History:  Procedure Laterality Date  . NO PAST SURGERIES     Family History: family history is not on file. Social History:  reports that she has never smoked. She has never used smokeless tobacco. She reports that she drinks alcohol. She reports that she does not use drugs.     Maternal Diabetes: No Genetic Screening: Declined Maternal Ultrasounds/Referrals: Normal Fetal Ultrasounds or other Referrals:  None Maternal Substance Abuse:  No Significant Maternal Medications:  Meds include: Protonix Significant Maternal Lab Results:  Lab values include: Group B Strep negative Other Comments:  gestational thrombocytopenia. GERD. BMZ complete 7/24, 7/25  Review of Systems  Eyes: Negative for blurred vision.  Cardiovascular: Positive for leg swelling.  Gastrointestinal: Negative for heartburn.   neg History   Blood pressure (!) 108/53, pulse (!) 112, temperature 98 F (36.7 C), temperature source Oral, resp. rate 18, height 5\' 3"  (1.6 m), weight 104.3 kg (230 lb), unknown if currently breastfeeding. Exam Physical Exam  Constitutional: She is oriented to person, place, and time. She appears well-nourished.  HENT:  Head: Atraumatic.  Eyes: EOM are normal.  Neck: Neck supple.  Cardiovascular: Regular rhythm.   Respiratory: Effort normal.  GI: Soft.  Genitourinary:  Genitourinary Comments: Vulva: large vulvar varicosities  Musculoskeletal: She exhibits edema.  Neurological: She is alert and oriented to person, place, and time. She has normal reflexes.   Skin: Skin is warm and dry.  Psychiatric: She has a normal mood and affect.   VE 2-3/50/-3 anterior Prenatal labs: ABO, Rh:  O positive Antibody:  neg Rubella:  Immune RPR:   NR HBsAg: neg    HIV:   NR GBS:  neg   Assessment/Plan Gestational thrombocytopenia Term gestation P) admit routine labs. Epidural. Pitocin IV  Anish Vana A 05/08/2017, 1:31 PM

## 2017-05-08 NOTE — Anesthesia Preprocedure Evaluation (Signed)
Anesthesia Evaluation  Patient identified by MRN, date of birth, ID band Patient awake    Reviewed: Allergy & Precautions, H&P , NPO status , Patient's Chart, lab work & pertinent test results  Airway Mallampati: II  TM Distance: >3 FB Neck ROM: full    Dental no notable dental hx. (+) Teeth Intact   Pulmonary neg pulmonary ROS,    Pulmonary exam normal breath sounds clear to auscultation       Cardiovascular negative cardio ROS Normal cardiovascular exam Rhythm:regular Rate:Normal     Neuro/Psych negative psych ROS   GI/Hepatic negative GI ROS, Neg liver ROS,   Endo/Other  Morbid obesity  Renal/GU negative Renal ROS  negative genitourinary   Musculoskeletal negative musculoskeletal ROS (+)   Abdominal Normal abdominal exam  (+) + obese,   Peds  Hematology negative hematology ROS (+)   Anesthesia Other Findings   Reproductive/Obstetrics (+) Pregnancy                             Anesthesia Physical Anesthesia Plan  ASA: III  Anesthesia Plan: Epidural   Post-op Pain Management:    Induction:   PONV Risk Score and Plan:   Airway Management Planned:   Additional Equipment:   Intra-op Plan:   Post-operative Plan:   Informed Consent: I have reviewed the patients History and Physical, chart, labs and discussed the procedure including the risks, benefits and alternatives for the proposed anesthesia with the patient or authorized representative who has indicated his/her understanding and acceptance.     Plan Discussed with:   Anesthesia Plan Comments:         Anesthesia Quick Evaluation

## 2017-05-08 NOTE — Progress Notes (Signed)
S: comfortable  O: epidural pitocin BP (!) 117/52   Pulse 75   Temp 98 F (36.7 C) (Oral)   Resp 16   Ht 5\' 3"  (1.6 m)   Wt 104.3 kg (230 lb)   BMI 40.74 kg/m  VE 3/50-60/-3  AROM clear fluid IUPC placed Tracing: baseline 130 (+) accels ctxq 2-3 mins  CBC    Component Value Date/Time   WBC 6.8 05/08/2017 1304   RBC 4.33 05/08/2017 1304   HGB 13.5 05/08/2017 1304   HGB 13.4 04/17/2017 1239   HCT 39.3 05/08/2017 1304   HCT 39.5 04/17/2017 1239   PLT 98 (L) 05/08/2017 1304   PLT 112 Large platelets present (L) 04/17/2017 1239   MCV 90.8 05/08/2017 1304   MCV 92.3 04/17/2017 1239   MCH 31.2 05/08/2017 1304   MCHC 34.4 05/08/2017 1304   RDW 14.8 05/08/2017 1304   RDW 14.4 04/17/2017 1239   LYMPHSABS 1.3 04/17/2017 1239   MONOABS 0.7 04/17/2017 1239   EOSABS 0.1 04/17/2017 1239   BASOSABS 0.0 04/17/2017 1239    IMP: gestational thrombocytopenia Term gestation P) cont pitocin

## 2017-05-08 NOTE — Anesthesia Pain Management Evaluation Note (Signed)
  CRNA Pain Management Visit Note  Patient: Rebecca Strickland, 26 y.o., female  "Hello I am a member of the anesthesia team at [facility]. We have an anesthesia team available at all times to provide care throughout the hospital, including epidural management and anesthesia for C-section. I don't know your plan for the delivery whether it a natural birth, water birth, IV sedation, nitrous supplementation, doula or epidural, but we want to meet your pain goals."   1.Was your pain managed to your expectations on prior hospitalizations?   Yes   2.What is your expectation for pain management during this hospitalization?     Epidural  3.How can we help you reach that goal?   Record the patient's initial score and the patient's pain goal.   Pain: 0  Pain Goal: 8 [facility] wants you to be able to say your pain was always managed very well.  Laban Emperor 05/08/2017

## 2017-05-09 ENCOUNTER — Encounter (HOSPITAL_COMMUNITY): Payer: Self-pay

## 2017-05-09 LAB — CBC
HCT: 38 % (ref 36.0–46.0)
Hemoglobin: 13.1 g/dL (ref 12.0–15.0)
MCH: 31 pg (ref 26.0–34.0)
MCHC: 34.5 g/dL (ref 30.0–36.0)
MCV: 90 fL (ref 78.0–100.0)
PLATELETS: 88 10*3/uL — AB (ref 150–400)
RBC: 4.22 MIL/uL (ref 3.87–5.11)
RDW: 14.9 % (ref 11.5–15.5)
WBC: 13.5 10*3/uL — ABNORMAL HIGH (ref 4.0–10.5)

## 2017-05-09 LAB — RPR: RPR: NONREACTIVE

## 2017-05-09 MED ORDER — DIPHENHYDRAMINE HCL 25 MG PO CAPS: 25.0000 mg | ORAL_CAPSULE | Freq: Four times a day (QID) | ORAL | Status: DC | PRN

## 2017-05-09 MED ORDER — SERTRALINE HCL 50 MG PO TABS
50.0000 mg | ORAL_TABLET | Freq: Every day | ORAL | Status: DC
  Administered 2017-05-09: 50 mg via ORAL
  Filled 2017-05-09 (×2): qty 1

## 2017-05-09 MED ORDER — WITCH HAZEL-GLYCERIN EX PADS: 1.0000 "application " | MEDICATED_PAD | CUTANEOUS | Status: DC | PRN

## 2017-05-09 MED ORDER — OXYCODONE HCL 5 MG PO TABS
10.0000 mg | ORAL_TABLET | ORAL | Status: DC | PRN
Start: 2017-05-09 — End: 2017-05-10

## 2017-05-09 MED ORDER — OXYCODONE HCL 5 MG PO TABS
5.0000 mg | ORAL_TABLET | ORAL | Status: DC | PRN
  Administered 2017-05-09: 5 mg via ORAL
  Filled 2017-05-09: qty 1

## 2017-05-09 MED ORDER — BENZOCAINE-MENTHOL 20-0.5 % EX AERO
1.0000 "application " | INHALATION_SPRAY | CUTANEOUS | Status: DC | PRN
  Administered 2017-05-09: 1 via TOPICAL
  Filled 2017-05-09: qty 56

## 2017-05-09 MED ORDER — FERROUS SULFATE 325 (65 FE) MG PO TABS
325.0000 mg | ORAL_TABLET | Freq: Two times a day (BID) | ORAL | Status: DC
  Administered 2017-05-09 – 2017-05-10 (×3): 325 mg via ORAL
  Filled 2017-05-09 (×3): qty 1

## 2017-05-09 MED ORDER — SIMETHICONE 80 MG PO CHEW
80.0000 mg | CHEWABLE_TABLET | ORAL | Status: DC | PRN
  Administered 2017-05-09 – 2017-05-10 (×2): 80 mg via ORAL
  Filled 2017-05-09: qty 1

## 2017-05-09 MED ORDER — SENNOSIDES-DOCUSATE SODIUM 8.6-50 MG PO TABS
2.0000 | ORAL_TABLET | ORAL | Status: DC
  Administered 2017-05-09 (×2): 2 via ORAL
  Filled 2017-05-09: qty 2

## 2017-05-09 MED ORDER — ZOLPIDEM TARTRATE 5 MG PO TABS: 5.0000 mg | ORAL_TABLET | Freq: Every evening | ORAL | Status: DC | PRN

## 2017-05-09 MED ORDER — ACETAMINOPHEN 325 MG PO TABS
650.0000 mg | ORAL_TABLET | ORAL | Status: DC | PRN
  Administered 2017-05-09 – 2017-05-10 (×6): 650 mg via ORAL
  Filled 2017-05-09 (×5): qty 2

## 2017-05-09 MED ORDER — DIBUCAINE 1 % RE OINT: 1.0000 "application " | TOPICAL_OINTMENT | RECTAL | Status: DC | PRN

## 2017-05-09 MED ORDER — ONDANSETRON HCL 4 MG/2ML IJ SOLN: 4.0000 mg | INTRAMUSCULAR | Status: DC | PRN

## 2017-05-09 MED ORDER — ONDANSETRON HCL 4 MG PO TABS: 4.0000 mg | ORAL_TABLET | ORAL | Status: DC | PRN

## 2017-05-09 MED ORDER — COCONUT OIL OIL: 1.0000 "application " | TOPICAL_OIL | Status: DC | PRN

## 2017-05-09 MED ORDER — PRENATAL MULTIVITAMIN CH
1.0000 | ORAL_TABLET | Freq: Every day | ORAL | Status: DC
  Administered 2017-05-09 – 2017-05-10 (×2): 1 via ORAL
  Filled 2017-05-09 (×2): qty 1

## 2017-05-09 NOTE — Lactation Note (Signed)
This note was copied from a baby's chart. Lactation Consultation Note  Patient Name: Rebecca Strickland MLYYT'K Date: 05/09/2017 Reason for consult: Initial assessment   P2, Baby 11 hours old.  Mother states she feels baby latches only briefly and falls asleep. Discussed normal newborn feeding behavior.  Mother states she bf first child for 6 weeks. She did state she may formula feed while here in hospital and then pump and bottle feed at home. Encouraged breast stimulation early to help establish her milk supply. Suggest calling if she would like help w/ breastfeeding. Mom made aware of O/P services, breastfeeding support groups, community resources, and our phone # for post-discharge questions.  Mom encouraged to feed baby 8-12 times/24 hours and with feeding cues.         Maternal Data Has patient been taught Hand Expression?: Yes Does the patient have breastfeeding experience prior to this delivery?: Yes  Feeding Feeding Type: Bottle Fed - Formula Nipple Type: Slow - flow  LATCH Score                   Interventions    Lactation Tools Discussed/Used     Consult Status Consult Status: Follow-up Date: 05/10/17 Follow-up type: In-patient    Dahlia Byes [facility] 05/09/2017, 11:35 AM

## 2017-05-09 NOTE — Progress Notes (Signed)
Notified Dr. Chaney Malling of platelet count after delivery of 88,000 from pre-delivery 98,000. Order for epidural removal received. Will have birthing suites RN d/c epidural.

## 2017-05-09 NOTE — Progress Notes (Signed)
PPD #0, SVD, 2nd degree laceration, Baby boy "Randie Heinz"  S:  Reports feeling good with minimal soreness  Denies any bruising or obvious bleeding              Tolerating po/ No nausea or vomiting / Denies dizziness or SOB             Bleeding is moderate             Pain controlled with Tylenol             Up ad lib / ambulatory / voiding QS  Newborn breast feeding with formula supplementation  / Circumcision - planning   O:               VS: BP 116/68 (BP Location: Left Arm)   Pulse 88   Temp 97.6 F (36.4 C) (Oral)   Resp 18   Ht 5\' 3"  (1.6 m)   Wt 104.3 kg (230 lb)   SpO2 100%   Breastfeeding? Unknown   BMI 40.74 kg/m    LABS:              Recent Labs  05/08/17 1304 05/09/17 0125  WBC 6.8 13.5*  HGB 13.5 13.1  PLT 98* 88*               Blood type: --/--/O POS (08/20 1304)  Rubella: Immune (01/23 0000)                     I&O: Intake/Output      08/20 0701 - 08/21 0700 08/21 0701 - 08/22 0700   Urine (mL/kg/hr) 1000    Blood 200    Total Output 1200     Net -1200                        Physical Exam:             Alert and oriented X3  Exam not performed as she was eating lunch on the couch  A: PPD # 0, SVD  2nd degree laceration   Gestational Thrombocytopenia - stable, delivered  Vulvar varicosities   Doing well - stable status  P: Routine post partum orders  No Motrin for now   Repeat CBC in AM   See lactation today   Anticipate discharge home tomorrow if stable   Carlean Jews, MSN, CNM [facility]

## 2017-05-09 NOTE — Plan of Care (Signed)
Problem: Nutritional: Goal: Mothers verbalization of comfort with breastfeeding process will improve Outcome: Progressing Pt reports she breastfed older son for 4 weeks then quit because "it wasn't for me." Wants to try to breastfeeding this newborn but states she isn't opposed to using formula either. Encouraged to call for latch assistance and to do skin-to-skin.

## 2017-05-09 NOTE — Anesthesia Postprocedure Evaluation (Addendum)
Anesthesia Post Note  Patient: Rebecca Strickland  Procedure(s) Performed: * No procedures listed *     Patient location during evaluation: Mother Baby Anesthesia Type: Epidural Level of consciousness: awake, awake and alert, oriented and patient cooperative Pain management: pain level controlled Vital Signs Assessment: post-procedure vital signs reviewed and stable Respiratory status: spontaneous breathing, nonlabored ventilation and respiratory function stable Cardiovascular status: stable Postop Assessment: no headache, no backache, patient able to bend at knees, no signs of nausea or vomiting and epidural receding Anesthetic complications: no    Last Vitals:  Vitals:   05/09/17 0305 05/09/17 0700  BP: 121/62 116/68  Pulse: 88 88  Resp: 18 18  Temp: 37 C 36.4 C  SpO2:      Last Pain:  Vitals:   05/09/17 0700  TempSrc: Oral  PainSc: 4    Pain Goal: Patients Stated Pain Goal: 2 (05/09/17 0700)               Analayah Brooke L

## 2017-05-10 LAB — CBC
HEMATOCRIT: 39 % (ref 36.0–46.0)
HEMOGLOBIN: 13.1 g/dL (ref 12.0–15.0)
MCH: 30.9 pg (ref 26.0–34.0)
MCHC: 33.6 g/dL (ref 30.0–36.0)
MCV: 92 fL (ref 78.0–100.0)
Platelets: 113 10*3/uL — ABNORMAL LOW (ref 150–400)
RBC: 4.24 MIL/uL (ref 3.87–5.11)
RDW: 15 % (ref 11.5–15.5)
WBC: 10.3 10*3/uL (ref 4.0–10.5)

## 2017-05-10 MED ORDER — ESCITALOPRAM OXALATE 20 MG PO TABS: 20.0000 mg | ORAL_TABLET | Freq: Every day | ORAL | 2 refills | Status: DC

## 2017-05-10 MED ORDER — TRAMADOL HCL 50 MG PO TABS: 50.0000 mg | ORAL_TABLET | Freq: Four times a day (QID) | ORAL | 0 refills | Status: DC | PRN

## 2017-05-10 MED ORDER — IBUPROFEN 600 MG PO TABS: 600.0000 mg | ORAL_TABLET | Freq: Three times a day (TID) | ORAL | 0 refills | Status: DC | PRN

## 2017-05-10 NOTE — Lactation Note (Signed)
This note was copied from a baby's chart. Lactation Consultation Note  Patient Name: Rebecca Strickland RRNHA'F Date: 05/10/2017 Reason for consult: Follow-up assessment Follow up visit at 34 hours of age.  Mom reports baby is bottle feeding well with formula.  Mom reports attempts to place baby on chest, but he is not latching.  Mom reports she thinks she will pump at home and has a medela pump.  LC encouraged mom to begin stimulating breast at least 8x/24 hours to establish a milk supply is she wants to provide breastmilk to her baby. Discussed milk transitioning to larger volume, engorgement care discussed.   Mom aware of o/p services as needed.   Maternal Data    Feeding Feeding Type: Bottle Fed - Formula Nipple Type: Slow - flow  LATCH Score                   Interventions    Lactation Tools Discussed/Used     Consult Status Consult Status: Complete    Franz Dell 05/10/2017, 10:49 AM

## 2017-05-10 NOTE — Discharge Summary (Signed)
Obstetric Discharge Summary Reason for Admission: induction of labor - gestational ITP (plt nadir 87) Prenatal Procedures: ultrasound and serial labs and hematology consult Intrapartum Procedures: spontaneous vaginal delivery with epidural Postpartum Procedures: none Complications-Operative and Postpartum: 2nd degree perineal laceration Hemoglobin  Date Value Ref Range Status  05/10/2017 13.1 12.0 - 15.0 g/dL Final   HGB  Date Value Ref Range Status  04/17/2017 13.4 11.6 - 15.9 g/dL Final   HCT  Date Value Ref Range Status  05/10/2017 39.0 36.0 - 46.0 % Final  04/17/2017 39.5 34.8 - 46.6 % Final    Physical Exam:  General: alert, cooperative and no distress Lochia: appropriate Uterine Fundus: firm Incision: healing well DVT Evaluation: No evidence of DVT seen on physical exam.  Discharge Diagnoses: Term Pregnancy-delivered / resolving gestational ITP (plt 113 at DC)  Discharge Information: Date: 05/10/2017 Activity: pelvic rest Diet: routine Medications: PNV, Ibuprofen and lexapro 20mg  and tramadol 50 (#10) Condition: stable Instructions: refer to practice specific booklet Discharge to: home Follow-up Information    [name], MD. Schedule an appointment as soon as possible for a visit in 6 week(s).   Specialty:  Obstetrics and Gynecology Contact information: [address] Rebecca Strickland [place] [address] [phone]           Newborn Data: Live born female  Birth Weight: 7 lb 13 oz (3545 g) APGAR: 8, 9  Home with mother.  Rebecca Strickland 05/10/2017, 10:04 AM

## 2017-05-10 NOTE — Progress Notes (Signed)
Patient stated her Rebecca Strickland score was 10 but that [name] CNM prescribed Lexapro since she has chronic anxiety and depression and was taking it before. She did not want to see a Child psychotherapist before discharge. And stated she understood signs and symptoms to call MD.

## 2017-05-10 NOTE — Progress Notes (Signed)
Patient ID: Rebecca Strickland, female   DOB: Jan 15, 1991, 26 y.o.   MRN: [id number]   PPD 1 SVD with 2nd degree  S:  Reports feeling well - no pain but some cramps and backache             Hx anxiety - lexapro prior to pregnancy / desires to restart lexapro instead of continuing on zoloft since bottlefeeding             Tolerating po/ No nausea or vomiting             Bleeding is light             Pain controlled with Tylenol only - not very helpful for backache             Up ad lib / ambulatory / voiding QS  Newborn formula feeding  / Circumcision done  O:               VS: BP 122/64   Pulse 79   Temp 98.3 F (36.8 C) (Oral)   Resp 18   Ht 5\' 3"  (1.6 m)   Wt 104.3 kg (230 lb)   SpO2 100%   Breastfeeding? Unknown   BMI 40.74 kg/m    LABS:              Recent Labs  05/09/17 0125 05/10/17 0509  WBC 13.5* 10.3  HGB 13.1 13.1  PLT 88* 113*               Blood type: --/--/O POS (08/20 1304)  Rubella: Immune (01/23 0000)                                Physical Exam:             Alert and oriented X3  Abdomen: soft, non-tender, non-distended              Fundus: firm, non-tender, Ueven  Extremities: no edema, no calf pain or tenderness    A: PPD # 1 with 2nd repair              Gestational ITP             Anxiety & depression  Doing well - stable status  P: Routine post partum orders  Change Rx to Lexapro restart today - no weaning indicated with switch - signs to call for PPD symptoms             Restart motrin at 600mg  dose every 8 hours                             may use tramadol PRN next few days for back pain instead of percocet             DC home - WOB booklet - instructions to call  [name] CNM, MSN, [facility] 05/10/2017, 9:57 AM

## 2017-05-12 ENCOUNTER — Encounter (HOSPITAL_COMMUNITY): Payer: Self-pay | Admitting: *Deleted

## 2017-05-12 ENCOUNTER — Inpatient Hospital Stay (HOSPITAL_COMMUNITY)
Admission: AD | Admit: 2017-05-12 | Discharge: 2017-05-12 | Disposition: A | Discharge status: Home / Self Care | Payer: 59 | Source: Ambulatory Visit | Attending: Obstetrics & Gynecology | Admitting: Obstetrics & Gynecology

## 2017-05-12 DIAGNOSIS — O864 Pyrexia of unknown origin following delivery: Secondary | ICD-10-CM | POA: Diagnosis not present

## 2017-05-12 DIAGNOSIS — N644 Mastodynia: Secondary | ICD-10-CM | POA: Insufficient documentation

## 2017-05-12 DIAGNOSIS — Z91040 Latex allergy status: Secondary | ICD-10-CM | POA: Insufficient documentation

## 2017-05-12 DIAGNOSIS — N61 Mastitis without abscess: Secondary | ICD-10-CM | POA: Diagnosis not present

## 2017-05-12 DIAGNOSIS — N6459 Other signs and symptoms in breast: Secondary | ICD-10-CM

## 2017-05-12 DIAGNOSIS — Z91013 Allergy to seafood: Secondary | ICD-10-CM | POA: Insufficient documentation

## 2017-05-12 DIAGNOSIS — Z91030 Bee allergy status: Secondary | ICD-10-CM | POA: Insufficient documentation

## 2017-05-12 MED ORDER — DICLOXACILLIN SODIUM 500 MG PO CAPS: 500.0000 mg | ORAL_CAPSULE | Freq: Four times a day (QID) | ORAL | 0 refills | Status: DC

## 2017-05-12 NOTE — MAU Provider Note (Signed)
Chief Complaint:  Postpartum Complications and Fever   First Provider Initiated Contact with Patient 05/12/17 2148       HPI: Rebecca Strickland is a 26 y.o. [id number] who presents to maternity admissions reporting fever and tightness and soreness in breasts.  Fever got as high as 103 at home.Marland Kitchen Has been pumping. She reports vaginal bleeding, vaginal itching/burning, urinary symptoms, h/a, dizziness, n/v, or fever/chills.    Fever   This is a new problem. The current episode started today. The problem occurs intermittently. The problem has been waxing and waning. The maximum temperature noted was 103 to 103.9 F. The temperature was taken using an oral thermometer. Pertinent negatives include no abdominal pain, diarrhea, nausea, sore throat, urinary pain or vomiting. Associated symptoms comments: Breast tenderness . She has tried nothing for the symptoms.   RN Note: Pt states she had a SVD on 05/09/2017. Pt states she had a temp of 101.5. Took Tylenol, went to bed. States she felt better, so she took more Tylenol. States temp after was 102 and 103. States her last temp was 100.8. Pt states she does not think she has been around anyone sick. Pt denies urinary s/s. States she does feel like her breasts are "tight" but does not feel like she is engorged or has mastitis.   Past Medical History: Past Medical History:  Diagnosis Date  . Migraine     Past obstetric history: OB History  Gravida Para Term Preterm AB Living  2 2 2     2   SAB TAB Ectopic Multiple Live Births        0 2    # Outcome Date GA Lbr Len/2nd Weight Sex Delivery Anes PTL Lv  2 Term 05/09/17 [redacted]w[redacted]d 03:55 / 02:10 7 lb 13 oz (3.545 kg) M Vag-Spont EPI  LIV  1 Term 04/07/15 [redacted]w[redacted]d 06:19 / 03:22 8 lb 4.3 oz (3.75 kg) M Vag-Vacuum EPI  LIV      Past Surgical History: Past Surgical History:  Procedure Laterality Date  . NO PAST SURGERIES      Family History: History reviewed. No pertinent family history.  Social  History: Social History  Substance Use Topics  . Smoking status: Never Smoker  . Smokeless tobacco: Never Used  . Alcohol use Yes     Comment: occasional    Allergies:  Allergies  Allergen Reactions  . Bee Venom Hives  . Shellfish Allergy Hives and Nausea And Vomiting  . Latex Rash    Meds:  Prescriptions Prior to Admission  Medication Sig Dispense Refill Last Dose  . acetaminophen (TYLENOL) 325 MG tablet Take 325 mg by mouth every 6 (six) hours as needed for mild pain, moderate pain, fever or headache.   prn  . escitalopram (LEXAPRO) 20 MG tablet Take 1 tablet (20 mg total) by mouth daily. 30 tablet 2   . ibuprofen (ADVIL,MOTRIN) 600 MG tablet Take 1 tablet (600 mg total) by mouth every 8 (eight) hours as needed. 30 tablet 0   . Prenatal Vit-Fe Fumarate-FA (PRENATAL MULTIVITAMIN) TABS tablet Take 1 tablet by mouth at bedtime.    05/07/2017 at Unknown time  . traMADol (ULTRAM) 50 MG tablet Take 1 tablet (50 mg total) by mouth every 6 (six) hours as needed for moderate pain (backache). 10 tablet 0     I have reviewed patient's Past Medical Hx, Surgical Hx, Family Hx, Social Hx, medications and allergies.  ROS:  Review of Systems  Constitutional: Positive for chills and fever.  HENT: Negative for sore throat.   Respiratory: Negative for shortness of breath.   Gastrointestinal: Negative for abdominal pain, constipation, diarrhea, nausea and vomiting.  Genitourinary: Negative for dysuria.   Other systems negative     Physical Exam  Patient Vitals for the past 24 hrs:  BP Temp Temp src Pulse Resp SpO2 Height Weight  05/12/17 2147 - (!) 100.4 F (38 C) Oral - - - - -  05/12/17 2057 116/73 99.2 F (37.3 C) Oral (!) 102 16 100 % 5\' 3"  (1.6 m) 223 lb (101.2 kg)   Constitutional: Well-developed, well-nourished female in no acute distress.  Cardiovascular: normal rate and rhythm, no ectopy audible, S1 & S2 heard, no murmur Breasts tight and tender bilaterally.  No erethema.  No  evidence of abscess.   Respiratory: normal effort, no distress. Lungs CTAB with no wheezes or crackles GI: Abd soft, non-tender.  Nondistended.  No rebound, No guarding.   Uterus nontender MS: Extremities nontender, no edema, normal ROM    Negative Homans Neurologic: Alert and oriented x 4.   Grossly nonfocal. GU: Neg CVAT. Skin:  Warm and Dry Psych:  Affect appropriate.  PELVIC EXAM: Deferred   Labs: --/--/O POS (08/20 1304) No results found for this or any previous visit (from the past 72 hour(s)).  Imaging:  No results found.  MAU Course/MDM: I have ordered labs as follows: none Imaging ordered: none   Consult Dr Juliene Pina with presentation and exam findings.  .She recommends Diclox for mastitis.   Treatments in MAU:  none Pt stable at time of discharge.  Assessment: Postpartum Fever Mastitis vs engorgement, presumptive mastitis  Plan: Discharge home Recommend Continue regular pumping, ice to breasts prn Rx sent for Dicloxicillin for mastitis Follow up with office early in week with status update   Encouraged to return here or to other Urgent Care/ED if she develops worsening of symptoms, increase in pain, fever, or other concerning symptoms.   Wynelle Bourgeois CNM, MSN Certified Nurse-Midwife 05/12/2017 9:48 PM

## 2017-05-12 NOTE — MAU Note (Signed)
Pt states she had a SVD on 05/09/2017. Pt states she had a temp of 101.5. Took Tylenol, went to bed. States she felt better, so she took more Tylenol. States temp after was 102 and 103. States her last temp was 100.8. Pt states she does not think she has been around anyone sick. Pt denies urinary s/s. States she does feel like her breasts are "tight" but does not feel like she is engorged or has mastitis.

## 2017-05-12 NOTE — MAU Note (Signed)
Urine sent to lab 

## 2017-05-12 NOTE — Discharge Instructions (Signed)
Breastfeeding and Mastitis  Mastitis is inflammation of the breast tissue. It can occur in women who are breastfeeding. This can make breastfeeding painful. Mastitis will sometimes go away on its own, especially if it is not caused by an infection (non-infectious mastitis). Your health care provider will help determine if medical treatment is needed. Treatment may be needed if the condition is caused by a bacterial infection (infectious mastitis).  What are the causes?  This condition is often associated with a blocked milkduct, which can happen when too much milk builds up in the breast. Causes of excess milk in the breast can include:  · Poor latch-on. If your baby is not latched onto the breast properly, he or she may not empty your breast completely while breastfeeding.  · Allowing too much time to pass between feedings.  · Wearing a bra or other clothing that is too tight. This puts extra pressure on the milk ducts so milk does not flow through them as it should.  · Milk remaining in the breast because it is overfilled (engorged).  · Stress and fatigue.    Mastitis can also be caused by a bacterial infection. Bacteria may enter the breast tissue through cuts, cracks, or openings in the skin near the nipple area. Cracks in the skin are often caused when your baby does not latch on properly to the breast.  What are the signs or symptoms?  Symptoms of this condition include:  · Swelling, redness, tenderness, and pain in an area of the breast. This usually affects the upper part of the breast, toward the armpit region. In most cases, it affects only one breast. In some cases, it may occur on both breasts at the same time and affect a larger portion of breast tissue.  · Swelling of the glands under the arm on the same side.  · Fatigue, headache, and flu-like muscle aches.  · Fever.  · Rapid pulse.    Symptoms usually last 2 to 5 days. Breast pain and redness are at their worst on day 2 and day 3, and they usually go  away by day 5. If an infection is left to progress, a collection of pus (abscess) may develop.  How is this diagnosed?  This condition can be diagnosed based on your symptoms and a physical exam. You may also have tests, such as:  · Blood tests to determine if your body is fighting a bacterial infection.  · Mammogram or ultrasound tests to rule out other problems or diseases.  · Fluid tests. If an abscess has developed, the fluid in the abscess may be removed with a needle. The fluid may be analyzed to determine if bacteria are present.  · Breast milk may be cultured and tested for bacteria.    How is this treated?  This condition will sometimes go away on its own. Your health care provider may choose to wait 24 hours after first seeing you to decide whether treatment is needed. If treatment is needed, it may include:  · Strategies to manage breastfeeding. This includes continuing to breastfeed or pump in order to allow adequate milk flow, using breast massage, and applying heat or cold to the affected area.  · Self-care such as rest and increased fluid intake.  · Medicine for pain.  · Antibiotic medicine to treat a bacterial infection. This is usually taken by mouth.  · If an abscess has developed, it may be treated by removing fluid with a needle.      Follow these instructions at home:  Medicines  · Take over-the-counter and prescription medicines only as told by your health care provider.  · If you were prescribed an antibiotic medicine, take it as told by your health care provider. Do not stop taking the antibiotic even if you start to feel better.  General instructions  · Do not wear a tight or underwire bra. Wear a soft, supportive bra.  · Increase your fluid intake, especially if you have a fever.  · Get plenty of rest.  For breastfeeding:  · Continue to empty your breasts as often as possible, either by breastfeeding or using an electric breast pump. This will lower the pressure and the pain that comes with  it. Ask your health care provider if changes need to be made to your breastfeeding or pumping routine.  · Keep your nipples clean and dry.  · During breastfeeding, empty the first breast completely before going to the other breast. If your baby is not emptying your breasts completely, use a breast pump to empty your breasts.  · Use breast massage during feeding or pumping sessions.  · If directed, apply moist heat to the affected area of your breast right before breastfeeding or pumping. Use the heat source that your health care provider recommends.  · If directed, put ice on the affected area of your breast right after breastfeeding or pumping:  ? Put ice in a plastic bag.  ? Place a towel between your skin and the bag.  ? Leave the ice on for 20 minutes.  · If you go back to work, pump your breasts while at work to stay in time with your nursing schedule.  · Do not allow your breasts to become engorged.  Contact a health care provider if:  · You have pus-like discharge from the breast.  · You have a fever.  · Your symptoms do not improve within 2 days of starting treatment.  · Your symptoms return after you have recovered from a breast infection.  Get help right away if:  · Your pain and swelling are getting worse.  · You have pain that is not controlled with medicine.  · You have a red line extending from the breast toward your armpit.  Summary  · Mastitis is inflammation of the breast tissue. It is often caused by a blocked milk duct or bacteria.  · This condition may be treated with hot and cold compresses, medicines, self-care, and certain breastfeeding strategies.  · If you were prescribed an antibiotic medicine, take it as told by your health care provider. Do not stop taking the antibiotic even if you start to feel better.  · Continue to empty your breasts as often as possible either by breastfeeding or using an electric breast pump.  This information is not intended to replace advice given to you by your  health care provider. Make sure you discuss any questions you have with your health care provider.  Document Released: 12/31/2004 Document Revised: 09/06/2016 Document Reviewed: 09/06/2016  Elsevier Interactive Patient Education © 2017 [employer]

## 2017-05-16 ENCOUNTER — Other Ambulatory Visit (HOSPITAL_BASED_OUTPATIENT_CLINIC_OR_DEPARTMENT_OTHER): Payer: 59

## 2017-05-16 ENCOUNTER — Ambulatory Visit (HOSPITAL_BASED_OUTPATIENT_CLINIC_OR_DEPARTMENT_OTHER): Payer: 59 | Admitting: Hematology

## 2017-05-16 VITALS — BP 107/71 | HR 78 | Temp 97.7°F | Resp 18 | Ht 63.0 in | Wt 217.1 lb

## 2017-05-16 DIAGNOSIS — D696 Thrombocytopenia, unspecified: Secondary | ICD-10-CM | POA: Diagnosis not present

## 2017-05-16 DIAGNOSIS — O99119 Other diseases of the blood and blood-forming organs and certain disorders involving the immune mechanism complicating pregnancy, unspecified trimester: Principal | ICD-10-CM

## 2017-05-16 LAB — CBC & DIFF AND RETIC
BASO%: 0.2 % (ref 0.0–2.0)
Basophils Absolute: 0 10*3/uL (ref 0.0–0.1)
EOS ABS: 0.1 10*3/uL (ref 0.0–0.5)
EOS%: 2 % (ref 0.0–7.0)
HCT: 41.8 % (ref 34.8–46.6)
HEMOGLOBIN: 14 g/dL (ref 11.6–15.9)
IMMATURE RETIC FRACT: 8.2 % (ref 1.60–10.00)
LYMPH%: 24.9 % (ref 14.0–49.7)
MCH: 30.8 pg (ref 25.1–34.0)
MCHC: 33.5 g/dL (ref 31.5–36.0)
MCV: 92.1 fL (ref 79.5–101.0)
MONO#: 0.6 10*3/uL (ref 0.1–0.9)
MONO%: 10.2 % (ref 0.0–14.0)
NEUT%: 62.7 % (ref 38.4–76.8)
NEUTROS ABS: 3.5 10*3/uL (ref 1.5–6.5)
Platelets: 214 10*3/uL (ref 145–400)
RBC: 4.54 10*6/uL (ref 3.70–5.45)
RDW: 13.6 % (ref 11.2–14.5)
Retic %: 1.11 % (ref 0.70–2.10)
Retic Ct Abs: 50.39 10*3/uL (ref 33.70–90.70)
WBC: 5.6 10*3/uL (ref 3.9–10.3)
lymph#: 1.4 10*3/uL (ref 0.9–3.3)

## 2017-05-16 LAB — COMPREHENSIVE METABOLIC PANEL
ALT: 37 U/L (ref 0–55)
AST: 43 U/L — AB (ref 5–34)
Albumin: 2.9 g/dL — ABNORMAL LOW (ref 3.5–5.0)
Alkaline Phosphatase: 105 U/L (ref 40–150)
Anion Gap: 8 mEq/L (ref 3–11)
BUN: 19.3 mg/dL (ref 7.0–26.0)
CHLORIDE: 106 meq/L (ref 98–109)
CO2: 26 meq/L (ref 22–29)
Calcium: 9.8 mg/dL (ref 8.4–10.4)
Creatinine: 0.7 mg/dL (ref 0.6–1.1)
GLUCOSE: 80 mg/dL (ref 70–140)
Potassium: 4.3 mEq/L (ref 3.5–5.1)
SODIUM: 140 meq/L (ref 136–145)
Total Bilirubin: 0.55 mg/dL (ref 0.20–1.20)
Total Protein: 7 g/dL (ref 6.4–8.3)

## 2017-05-23 NOTE — Progress Notes (Signed)
Marland Kitchen    HEMATOLOGY/ONCOLOGY CLINIC NOTE  Date of Service: .05/16/2017   Patient Care Team: Patient, No Pcp Per as PCP - General (General Practice) Ob-Gyn - Dr Nena Jordan A. Cousins MD  CHIEF COMPLAINTS/PURPOSE OF CONSULTATION:  Thrombocytopenia in Pregnancy  HISTORY OF PRESENTING ILLNESS:  Rebecca Strickland is a wonderful 26 y.o. female who has been referred to [place] by  Dr Nena Jordan A. Cousins MD for evaluation and management of thrombocytopenia in pregnancy.  Patient notes that she had mild thrombocytopenia during the third trimester of her first pregnancy about 2 years ago. She had normal platelet counts and her first trimester and her platelet counts dipped to just under 150k by 30th week and was at [facility] when she had an induced vaginal delivery at 39 weeks. Patient had an uneventful epidural an uneventful labor with no issues with excessive bleeding or postpartum bleeding. Patient does not know if her platelet counts are completely normalized after delivery but this is likely the case. Patient had no preeclampsia, no acute fatty liver pregnancy, no HELLP syndrome etc.   During her current pregnancy she notes that her platelet counts were within normal limits during her first trimester. She notes that during her late second trimester her platelet counts dropped to 119k and on recent labs on 04/11/2017 and 34 weeks the platelet counts were noted to be 95k. No issues with bleeding. No hypertension no evidence of proteinuria no evidence of abnormal liver function tests. Patient notes that she has been feeling well overall.  No significant use of NSAIDs over-the-counter. Does use protonix as needed for acid reflux. Notes that her migraine headaches have been worse in the third trimester and she is using Vicodin as needed for this.  Repeat labs in clinic today at 35 weeks pregnancy show platelet counts better improved at 112k. With normal hemoglobin of 13.4 MCV is 92.3 and normal WBC count of 8.4k.  CMP is within normal limits with normal LFTs. LDH is not elevated and this suggests against any evidence of hemolysis.  INTERVAL HISTORY  Patient is here for follow-up of her thrombocytopenia. She was admitted for her labor and delivery on 05/08/2017 and her platelet counts were 98k with repeat counts on 05/10/2017 of 113k. She had normal vaginal delivery with epidural analgesia and no issues with excessive bleeding. She delivered a healthy baby and is here for follow-up about a week after her delivery. Her platelet counts today have completely normalized at 214k suggesting no underlying ITP. Her thrombocytopenia was consistent with gestational thrombocytopenia. Patient has no other acute concerns at this time  MEDICAL HISTORY:  Past Medical History:  Diagnosis Date  . Migraine   History of chickenpox History of gestational thrombocytopenia with her first pregnancy with platelet counts at 39 weeks 90k. GERD Depression G2 P1 L1. Patient has a 67-year-old son  SURGICAL HISTORY: Past Surgical History:  Procedure Laterality Date  . NO PAST SURGERIES      SOCIAL HISTORY: Social History   Social History  . Marital status: Married    Spouse name: N/A  . Number of children: N/A  . Years of education: N/A   Occupational History  . Not on file.   Social History Main Topics  . Smoking status: Never Smoker  . Smokeless tobacco: Never Used  . Alcohol use Yes     Comment: occasional  . Drug use: No  . Sexual activity: Yes    Birth control/ protection: None   Other Topics Concern  . Not on  file   Social History Narrative  . No narrative on file  Patient works at [employer]  FAMILY HISTORY: No family history on file. No family history of blood disorders  ALLERGIES:  is allergic to bee venom; shellfish allergy; and latex.  MEDICATIONS:  Current Outpatient Prescriptions  Medication Sig Dispense Refill  . acetaminophen (TYLENOL) 325 MG tablet Take 325 mg by mouth every 6  (six) hours as needed for mild pain, moderate pain, fever or headache.    . dicloxacillin (DYNAPEN) 500 MG capsule Take 1 capsule (500 mg total) by mouth every 6 (six) hours. 40 capsule 0  . escitalopram (LEXAPRO) 20 MG tablet Take 1 tablet (20 mg total) by mouth daily. (Patient not taking: Reported on 05/12/2017) 30 tablet 2  . ibuprofen (ADVIL,MOTRIN) 600 MG tablet Take 1 tablet (600 mg total) by mouth every 8 (eight) hours as needed. (Patient taking differently: Take 600 mg by mouth every 8 (eight) hours as needed for moderate pain. ) 30 tablet 0  . Prenatal Vit-Fe Fumarate-FA (PRENATAL MULTIVITAMIN) TABS tablet Take 1 tablet by mouth at bedtime.     . sertraline (ZOLOFT) 50 MG tablet Take 50 mg by mouth at bedtime.     No current facility-administered medications for this visit.     REVIEW OF SYSTEMS:    10 Point review of Systems was done is negative except as noted above.  PHYSICAL EXAMINATION: ECOG PERFORMANCE STATUS: 0 - Asymptomatic  . Vitals:   05/16/17 1048  BP: 107/71  Pulse: 78  Resp: 18  Temp: 97.7 F (36.5 C)  SpO2: 98%   Filed Weights   05/16/17 1048  Weight: 217 lb 1.6 oz (98.5 kg)   .Body mass index is 38.46 kg/m.  GENERAL:alert, in no acute distress and comfortable SKIN: no acute rashes, no significant lesions EYES: conjunctiva are pink and non-injected, sclera anicteric OROPHARYNX: MMM, no exudates, no oropharyngeal erythema or ulceration NECK: supple, no JVD LYMPH:  no palpable lymphadenopathy in the cervical, axillary or inguinal regions LUNGS: clear to auscultation b/l with normal respiratory effort HEART: regular rate & rhythm ABDOMEN:  normoactive bowel sounds , non tender, not distended.Gravid uterus. No palpable hepatosplenomegaly Extremity: no pedal edema PSYCH: alert & oriented x 3 with fluent speech NEURO: no focal motor/sensory deficits  LABORATORY DATA:  I have reviewed the data as listed  . CBC Latest Ref Rng & Units 05/16/2017  05/10/2017 05/09/2017  WBC 3.9 - 10.3 10e3/uL 5.6 10.3 13.5(H)  Hemoglobin 11.6 - 15.9 g/dL 69.614.0 29.513.1 28.413.1  Hematocrit 34.8 - 46.6 % 41.8 39.0 38.0  Platelets 145 - 400 10e3/uL 214 113(L) 88(L)    . CMP Latest Ref Rng & Units 05/16/2017 04/17/2017 04/11/2017  Glucose 70 - 140 mg/dl 80 88 132(G109(H)  BUN 7.0 - 26.0 mg/dL 40.119.3 0.2(V6.8(L) 7  Creatinine 0.6 - 1.1 mg/dL 0.7 0.6 2.530.52  Sodium 664136 - 145 mEq/L 140 138 136  Potassium 3.5 - 5.1 mEq/L 4.3 3.5 3.6  Chloride 101 - 111 mmol/L - - 105  CO2 22 - 29 mEq/L 26 21(L) 22  Calcium 8.4 - 10.4 mg/dL 9.8 9.7 9.0  Total Protein 6.4 - 8.3 g/dL 7.0 6.7 6.8  Total Bilirubin 0.20 - 1.20 mg/dL 4.030.55 4.740.38 0.7  Alkaline Phos 40 - 150 U/L 105 103 86  AST 5 - 34 U/L 43(H) 18 24  ALT 0 - 55 U/L 37 17 18     RADIOGRAPHIC STUDIES: I have personally reviewed the radiological images as listed and  agreed with the findings in the report. No results found.  ASSESSMENT & PLAN:   26 year old Caucasian female with   #1 Mild thrombocytopenia platelet counts of 112k at [redacted] weeks gestation. Absence of thrombocytopenia and nonpregnant state and during the first trimester makes the likelihood of ITP low. Her presentation currently is consistent with gestational thrombocytopenia. No evidence of hypertension, proteinuria, abnormal LFTs to suggest any other pregnancy related complications. Plan -platelet counts at term were 98-113k and she recently had a healthy baby with full-term vaginal delivery without any issues with bleeding. -Her platelet counts today about a week after delivery have completely normalized at 214k. This suggests that her thrombocytopenia was gestational with no concerns with ITP. -No additional intervention from a hematologic standpoint at this time. -No clear contraindications to further pregnancies from a thrombocytopenia standpoint though she will need monitoring if she decides to have additional children.  Continue follow-up with primary care  physician/OB/GYN. Kindly reconsult [place] if any new questions or concerns arise.  All of the patients questions were answered with apparent satisfaction. The patient knows to call the clinic with any problems, questions or concerns.  I spent 20 minutes counseling the patient face to face. The total time spent in the appointment was 20 minutes and more than 50% was on counseling and direct patient cares.    Wyvonnia Lora MD MS AAHIVMS [facility] Hematology/Oncology Physician [facility]  (Office):       [phone] (Work cell):  [phone] (Fax):           [phone]

## 2017-06-26 DIAGNOSIS — Z130 Encounter for screening for diseases of the blood and blood-forming organs and certain disorders involving the immune mechanism: Secondary | ICD-10-CM | POA: Diagnosis not present

## 2017-12-08 DIAGNOSIS — J209 Acute bronchitis, unspecified: Secondary | ICD-10-CM | POA: Diagnosis not present

## 2017-12-18 ENCOUNTER — Other Ambulatory Visit: Payer: Self-pay | Admitting: Physician Assistant

## 2017-12-18 ENCOUNTER — Ambulatory Visit
Admission: RE | Admit: 2017-12-18 | Discharge: 2017-12-18 | Disposition: A | Discharge status: Home / Self Care | Payer: 59 | Source: Ambulatory Visit | Attending: Physician Assistant | Admitting: Physician Assistant

## 2017-12-18 DIAGNOSIS — R05 Cough: Secondary | ICD-10-CM

## 2017-12-18 DIAGNOSIS — R059 Cough, unspecified: Secondary | ICD-10-CM

## 2017-12-18 DIAGNOSIS — R0789 Other chest pain: Secondary | ICD-10-CM | POA: Diagnosis not present

## 2017-12-18 DIAGNOSIS — R0602 Shortness of breath: Secondary | ICD-10-CM | POA: Diagnosis not present

## 2017-12-18 DIAGNOSIS — R079 Chest pain, unspecified: Secondary | ICD-10-CM | POA: Diagnosis not present

## 2018-09-13 DIAGNOSIS — M545 Low back pain: Secondary | ICD-10-CM | POA: Diagnosis not present

## 2018-09-13 DIAGNOSIS — Z23 Encounter for immunization: Secondary | ICD-10-CM | POA: Diagnosis not present

## 2018-10-04 DIAGNOSIS — Z3202 Encounter for pregnancy test, result negative: Secondary | ICD-10-CM | POA: Diagnosis not present

## 2018-11-20 DIAGNOSIS — Z6841 Body Mass Index (BMI) 40.0 and over, adult: Secondary | ICD-10-CM | POA: Diagnosis not present

## 2018-11-20 DIAGNOSIS — N926 Irregular menstruation, unspecified: Secondary | ICD-10-CM | POA: Diagnosis not present

## 2018-11-20 DIAGNOSIS — Z131 Encounter for screening for diabetes mellitus: Secondary | ICD-10-CM | POA: Diagnosis not present

## 2018-11-20 DIAGNOSIS — Z01419 Encounter for gynecological examination (general) (routine) without abnormal findings: Secondary | ICD-10-CM | POA: Diagnosis not present

## 2018-12-19 DIAGNOSIS — N926 Irregular menstruation, unspecified: Secondary | ICD-10-CM | POA: Diagnosis not present

## 2019-10-11 IMAGING — CR DG CHEST 2V
2 series · 2 of 2 positions shown · non-contrast
Comparison: None.

CLINICAL DATA: Cough for 1 month

EXAM:
CHEST - 2 VIEW

[w chest pa]
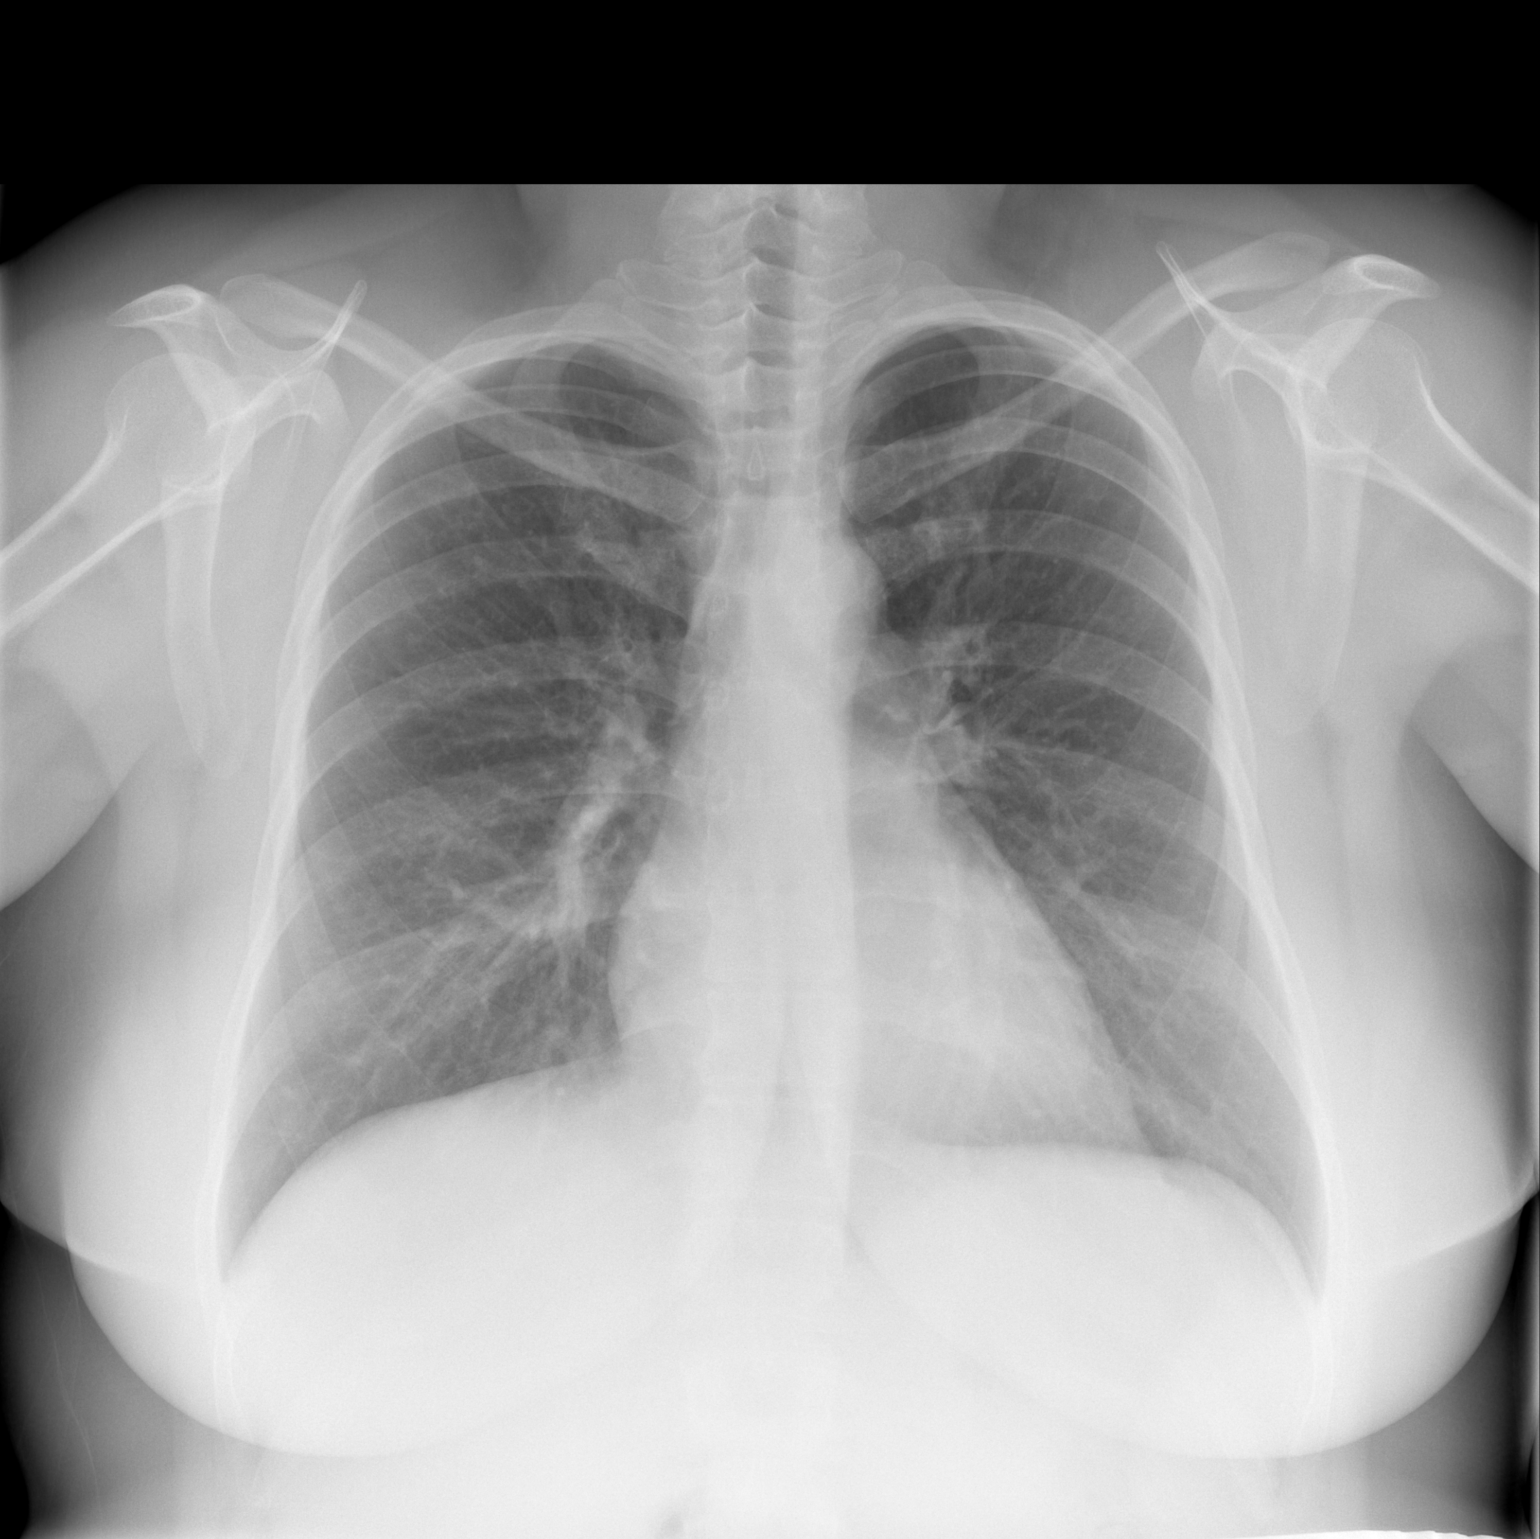

[w chest lat]
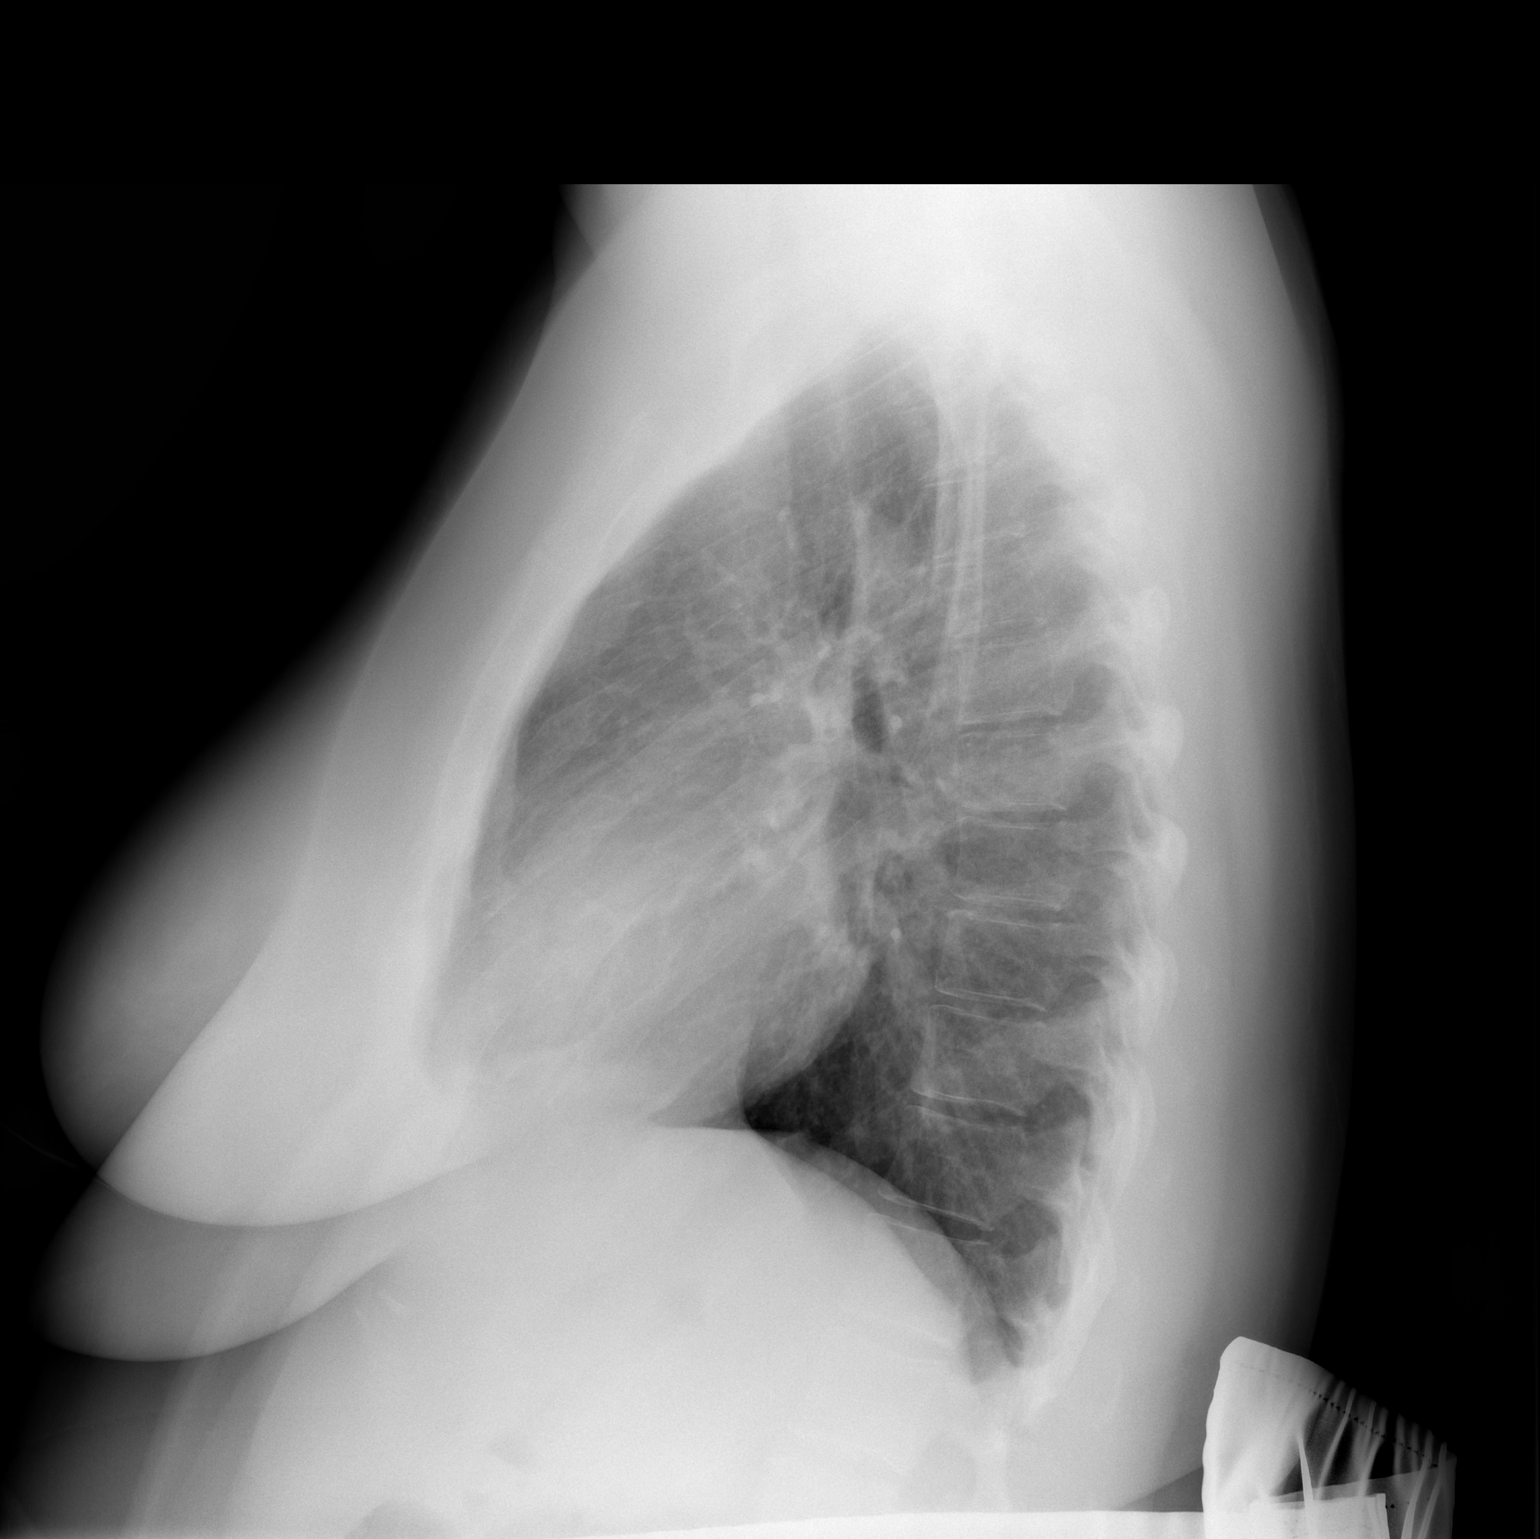

[2 of 2 positions shown; findings below may reference images not displayed]

FINDINGS: Prominent markings at the right base on the AP view, but not seen
laterally, likely summation shadows. Borderline costophrenic sulcus
blunting posteriorly, but no definite effusion. No edema. No
pneumothorax. Normal heart size and aortic contours.
IMPRESSION: No definite explanation for cough.

## 2020-11-17 ENCOUNTER — Telehealth: Payer: Self-pay | Admitting: Genetic Counselor

## 2020-11-17 NOTE — Telephone Encounter (Signed)
Received a genetic counseling referral from [facility] for brca testing. Pt has been cld and scheduled to see Cari for a mychart video visit on 3/3 at 9am.

## 2020-11-19 ENCOUNTER — Inpatient Hospital Stay: Payer: 59 | Attending: Hematology and Oncology | Admitting: Genetic Counselor

## 2020-11-19 ENCOUNTER — Other Ambulatory Visit: Payer: Self-pay

## 2020-11-19 DIAGNOSIS — Z8041 Family history of malignant neoplasm of ovary: Secondary | ICD-10-CM | POA: Diagnosis not present

## 2020-11-19 DIAGNOSIS — Z803 Family history of malignant neoplasm of breast: Secondary | ICD-10-CM | POA: Diagnosis not present

## 2020-11-20 ENCOUNTER — Encounter: Payer: Self-pay | Admitting: Genetic Counselor

## 2020-11-20 DIAGNOSIS — Z803 Family history of malignant neoplasm of breast: Secondary | ICD-10-CM

## 2020-11-20 DIAGNOSIS — Z8041 Family history of malignant neoplasm of ovary: Secondary | ICD-10-CM

## 2020-11-20 HISTORY — DX: Family history of malignant neoplasm of breast: Z80.3

## 2020-11-20 HISTORY — DX: Family history of malignant neoplasm of ovary: Z80.41

## 2020-11-23 NOTE — Progress Notes (Signed)
REFERRING PROVIDER: Lennie Odor, PA [address] [place],  [place] [address]  PRIMARY PROVIDER:  Lennie Odor, PA  PRIMARY REASON FOR VISIT:  1. Family history of breast cancer   2. Family history of ovarian cancer    I connected with Rebecca Strickland on 11/19/2020 at [place] EDT by [place] video conference and verified that I am speaking with the correct person using two identifiers.   Patient location: Home Provider location: [facility] Office  HISTORY OF PRESENT ILLNESS:   Rebecca Strickland, a 30 y.o. female, was seen for a [place] cancer genetics consultation at the request of Barth Kirks Redmon, PA due to a family history of cancer and a reported BRCA mutation.  Rebecca Strickland presents to clinic today to discuss the possibility of a hereditary predisposition to cancer, to discuss genetic testing, and to further clarify her future cancer risks, as well as potential cancer risks for family members.   Rebecca Strickland is a 30 y.o. female with no personal history of cancer.    CANCER HISTORY:  Oncology History   No history exists.    RISK FACTORS:  Menarche was at age 33.  First live birth at age 53.  OCP use for approximately 4 years.  Ovaries intact: yes.  Hysterectomy: no.  Menopausal status: premenopausal.  HRT use: 0 years. Colonoscopy: no; not examined. Mammogram within the last year: no. Number of breast biopsies: 0. Up to date with pelvic exams: yes. Any excessive radiation exposure in the past: no  Past Medical History:  Diagnosis Date  . Family history of breast cancer 11/20/2020  . Family history of ovarian cancer 11/20/2020  . Migraine     Past Surgical History:  Procedure Laterality Date  . NO PAST SURGERIES      Social History   Socioeconomic History  . Marital status: Married    Spouse name: Not on file  . Number of children: Not on file  . Years of education: Not on file  . Highest education level: Not on file  Occupational History  . Not on file  Tobacco Use   . Smoking status: Never Smoker  . Smokeless tobacco: Never Used  Vaping Use  . Vaping Use: Never used  Substance and Sexual Activity  . Alcohol use: Yes    Comment: occasional; once every 2 weeks  . Drug use: No  . Sexual activity: Yes    Birth control/protection: None  Other Topics Concern  . Not on file  Social History Narrative  . Not on file   Social Determinants of Health   Financial Resource Strain: Not on file  Food Insecurity: Not on file  Transportation Needs: Not on file  Physical Activity: Not on file  Stress: Not on file  Social Connections: Not on file     FAMILY HISTORY:  We obtained a detailed, 4-generation family history.  Significant diagnoses are listed below: Family History  Problem Relation Age of Onset  . Breast cancer Mother 10       reported BRCA2 variant  . Ovarian cancer Paternal Grandmother 20  . Cancer Paternal Grandfather 2       sarcoma or lymphoma?  . Breast cancer Other        MGM's sister; two primaries after age 75  . Liver cancer Other        dx unknown age  . Other Other        PGM's sister; brain tumor     Rebecca Strickland has two  sons, age 46 and 39.  She has one brother, age 33.  Rebecca Strickland mother was recently diagnosed with breast cancer at age 60.  She has a reported mutation in BRCA2.  No report was available for review today.  Rebecca Strickland maternal grandmother's sister had a history of breast cancer and brother had a history of liver cancer.  Rebecca Strickland father is living at age 17 without cancer.  Rebecca Strickland paternal grandmother, age 39, had ovarian cancer at age 62 and paternal grandfather had possible sarcoma or lymphoma at age 73.    Rebecca Strickland is unaware of previous family history of genetic testing for hereditary cancer risks besides that mentioned above. Patient's ancestors are of White/Caucasian and Native American descent. There is no reported Ashkenazi Jewish ancestry. There is no known consanguinity.  GENETIC  COUNSELING ASSESSMENT: Ms. Morici is a 30 y.o. female with a family history of cancer which is somewhat suggestive of a hereditary cancer syndrome and predisposition to cancer given the presence of cancer at a young age in her maternal and paternal family. We, therefore, discussed and recommended the following at today's visit.   DISCUSSION: We discussed that 5 - 10% of cancer is hereditary, with most cases of hereditary heredtiary breast and ovarian cancer associated with mutations in BRCA1/2.  We discussed that if her mother has a mutation in [place], Rebecca Strickland has a 50% chance of having the same BRCA2 mutation.  We briefly reviewed cancers associated with BRCA2 mutations including but not limited to breast cancer, ovarian cancer, pancreatic cancer, and melanoma as well as management strategies. There are other genes that can be associated with hereditary breast and/or ovarian cancer syndromes.  Type of cancer risk and level of risk are gene-specific.  We discussed that testing is beneficial for several reasons, including knowing about other cancer risks, identifying potential screening and risk-reduction options that may be appropriate, and to understanding if other family members could be at risk for cancer and allowing them to undergo genetic testing.  We reviewed the characteristics, features and inheritance patterns of hereditary cancer syndromes. We also discussed genetic testing, including the appropriate family members to test, the process of testing, insurance coverage and turn-around-time for results. We discussed the implications of a negative, positive, carrier and/or variant of uncertain significant result. We discussed that negative results would be uninformative given that Rebecca Strickland does not have a personal history of cancer. We recommended Rebecca Strickland pursue genetic testing for a panel that contains genes associated with breast and ovarian.  Rebecca Strickland was offered a common hereditary cancer  panel (47 genes) and an expanded pan-cancer panel (84 genes). Rebecca Strickland was informed of the benefits and limitations of each panel, including that expanded pan-cancer panels contain several genes that do not have clear management guidelines at this point in time.  We also discussed that as the number of genes included on a panel increases, the chances of variants of uncertain significance increases.  After considering the benefits and limitations of each gene panel, Rebecca Strickland elected to have an expanded pan-cancer panel through [facility].  The Multi-Cancer + RNA Panel offered by [facility] includes sequencing and/or deletion/duplication analysis of the following 84 genes:  AIP*, ALK, APC*, ATM*, AXIN2*, BAP1*, BARD1*, BLM*, BMPR1A*, BRCA1*, BRCA2*, BRIP1*, CASR, CDC73*, CDH1*, CDK4, CDKN1B*, CDKN1C*, CDKN2A, CEBPA, CHEK2*, CTNNA1*, DICER1*, DIS3L2*, EGFR, EPCAM, FH*, FLCN*, GATA2*, GPC3, GREM1, HOXB13, HRAS, KIT, MAX*, MEN1*, MET, MITF, MLH1*, MSH2*, MSH3*, MSH6*, MUTYH*, NBN*, NF1*, NF2*, NTHL1*, PALB2*, PDGFRA,  PHOX2B, PMS2*, POLD1*, POLE*, POT1*, PRKAR1A*, PTCH1*, PTEN*, RAD50*, RAD51C*, RAD51D*, RB1*, RECQL4, RET, RUNX1*, SDHA*, SDHAF2*, SDHB*, SDHC*, SDHD*, SMAD4*, SMARCA4*, SMARCB1*, SMARCE1*, STK11*, SUFU*, TERC, TERT, TMEM127*, Tp53*, TSC1*, TSC2*, VHL*, WRN*, and WT1.  RNA analysis is performed for * genes.   Based on Rebecca Strickland family history of cancer in both in maternal and paterna family, she meets medical criteria for genetic testing. Despite that she meets criteria, she may still have an out of pocket cost. We discussed that if she has an out of pocket cost for testing, the laboratory will contact her to discuss self-pay and patient pay assistance programs.   We discussed that some people do not want to undergo genetic testing due to fear of genetic discrimination.  A federal law called the Genetic Information Non-Discrimination Act (GINA) of 2008 helps protect individuals against genetic  discrimination based on their genetic test results.  It impacts both health insurance and employment.  With health insurance, it protects against increased premiums, being kicked off insurance or being forced to take a test in order to be insured.  For employment it protects against hiring, firing and promoting decisions based on genetic test results.  GINA does not apply to those in the [employer], those who work for companies with less than 15 employees, and new life insurance or long-term disability insurance policies.  Health status due to a cancer diagnosis is not protected under GINA.  We requested that Ms. Kochanowski obtain a copy of her mother's genetic testing report to provide to [place].  We discussed the importance of obtaining these results including deciding on an appropriate laboratory to send testing, ensuring that the family variant is including in testing, and knowing how to interpret results.  Ms. Coke agreed to speak with her mother about obtaining a copy of these results.   PLAN: After considering the risks, benefits, and limitations, Ms. Cuthrell provided informed consent to pursue genetic testing.  The blood sample will be collected on 11/26/2020 and will be sent to [facility] for analysis of the Multi-Cancer +RNA Panel. Results should be available within approximately 3 weeks' time, at which point they will be disclosed by telephone to Ms. Breault, as will any additional recommendations warranted by these results. Ms. Reader will receive a summary of her genetic counseling visit and a copy of her results once available. This information will also be available in Epic.   Lastly, we encouraged Ms. Salaam to remain in contact with cancer genetics annually so that we can continuously update the family history and inform her of any changes in cancer genetics and testing that may be of benefit for this family.   Ms. Sciarra questions were answered to her satisfaction today. Our contact  information was provided should additional questions or concerns arise. Thank you for the referral and allowing [place] to share in the care of your patient.   Shanesha Bednarz M. Joette Catching, [place], [facility] Genetic Counselor [email or web address] (P) [phone]  The patient was seen for a total of 30 minutes in face-to-face genetic counseling.  Drs. Magrinat, Lindi Adie and/or Burr Medico were available to discuss this case as needed.  _______________________________________________________________________ For Office Staff:  Number of people involved in session: 1 Was an Intern/ student involved with case: no

## 2020-11-26 ENCOUNTER — Inpatient Hospital Stay: Payer: 59

## 2020-11-26 ENCOUNTER — Telehealth: Payer: Self-pay | Admitting: Genetic Counselor

## 2020-11-26 NOTE — Telephone Encounter (Signed)
Rebecca Strickland was unable to attend lab draw for genetics because unable to secure child care.  Rebecca Strickland agreed to saliva kit being sent to house rather than rescheduling blood draw.

## 2020-12-14 ENCOUNTER — Telehealth: Payer: Self-pay | Admitting: Genetic Counselor

## 2020-12-14 ENCOUNTER — Encounter: Payer: Self-pay | Admitting: Genetic Counselor

## 2020-12-14 ENCOUNTER — Ambulatory Visit: Payer: Self-pay | Admitting: Genetic Counselor

## 2020-12-14 DIAGNOSIS — Z1379 Encounter for other screening for genetic and chromosomal anomalies: Secondary | ICD-10-CM | POA: Insufficient documentation

## 2020-12-14 DIAGNOSIS — Z803 Family history of malignant neoplasm of breast: Secondary | ICD-10-CM

## 2020-12-14 DIAGNOSIS — Z8041 Family history of malignant neoplasm of ovary: Secondary | ICD-10-CM

## 2020-12-14 NOTE — Progress Notes (Signed)
HPI:  Ms. Greenwood was previously seen in the [place] clinic due to a family history of cancer and concerns regarding a hereditary predisposition to cancer. Please refer to our prior cancer genetics clinic note for more information regarding our discussion, assessment and recommendations, at the time. Ms. Semrad recent genetic test results were disclosed to her, as were recommendations warranted by these results. These results and recommendations are discussed in more detail below.  CANCER HISTORY:  Oncology History   No history exists.    FAMILY HISTORY:  We obtained a detailed, 4-generation family history.  Significant diagnoses are listed below: Family History  Problem Relation Age of Onset   Breast cancer Mother 22       reported BRCA2 variant   Ovarian cancer Paternal Grandmother 3   Cancer Paternal Grandfather 88       sarcoma or lymphoma?   Breast cancer Other        MGM's sister; two primaries after age 66   Liver cancer Other        dx unknown age   Other Other        PGM's sister; brain tumor      Ms. Arena has two sons, age 40 and 8.  She has one brother, age 57.  Ms. Tomasello mother was recently diagnosed with breast cancer at age 75.  She has a reported mutation in BRCA2.  No report was available for review today.  Ms. Lemaire maternal grandmother's sister had a history of breast cancer and brother had a history of liver cancer.  Ms. Montante father is living at age 66 without cancer.  Ms. Leisey paternal grandmother, age 63, had ovarian cancer at age 50 and paternal grandfather had possible sarcoma or lymphoma at age 74.     Ms. Kelsay is unaware of previous family history of genetic testing for hereditary cancer risks besides that mentioned above. Patient's ancestors are of White/Caucasian and Native American descent. There is no reported Ashkenazi Jewish ancestry. There is no known consanguinity.  GENETIC TEST RESULTS: Genetic testing reported out  on December 12, 2020.  The [facility] Multi-Cancer Panel found no pathogenic mutations. The Multi-Cancer Panel offered by [facility] includes sequencing and/or deletion duplication testing of the following 84 genes: AIP, ALK, APC, ATM, AXIN2,BAP1,  BARD1, BLM, BMPR1A, BRCA1, BRCA2, BRIP1, CASR, CDC73, CDH1, CDK4, CDKN1B, CDKN1C, CDKN2A (p14ARF), CDKN2A (p16INK4a), CEBPA, CHEK2, CTNNA1, DICER1, DIS3L2, EGFR (c.2369C>T, p.Thr790Met variant only), EPCAM (Deletion/duplication testing only), FH, FLCN, GATA2, GPC3, GREM1 (Promoter region deletion/duplication testing only), HOXB13 (c.251G>A, p.Gly84Glu), HRAS, KIT, MAX, MEN1, MET, MITF (c.952G>A, p.Glu318Lys variant only), MLH1, MSH2, MSH3, MSH6, MUTYH, NBN, NF1, NF2, NTHL1, PALB2, PDGFRA, PHOX2B, PMS2, POLD1, POLE, POT1, PRKAR1A, PTCH1, PTEN, RAD50, RAD51C, RAD51D, RB1, RECQL4, RET, RUNX1, SDHAF2, SDHA (sequence changes only), SDHB, SDHC, SDHD, SMAD4, SMARCA4, SMARCB1, SMARCE1, STK11, SUFU, TERC, TERT, TMEM127, TP53, TSC1, TSC2, VHL, WRN and WT1.   The test report has been scanned into EPIC and is located under the Molecular Pathology section of the Results Review tab.  A portion of the result report is included below for reference.     We discussed with Ms. Galyon that because current genetic testing is not perfect, it is possible there may be a gene mutation in one of these genes that current testing cannot detect, but that chance is small.  We also discussed, that there could be another gene that has not yet been discovered, or that we have not yet tested, that is responsible  for the cancer diagnoses in the family. It is also possible there is a hereditary cause for the cancer in the family that Ms. Ridinger did not inherit and therefore was not identified in her testing.  Therefore, it is important to remain in touch with cancer genetics in the future so that we can continue to offer Ms. Cariker the most up to date genetic testing.   Genetic testing did identify a variant  of uncertain significance (VUS) was identified in the [place] gene called c.601G>A (p.Gly201Ser).  At this time, it is unknown if this variant is associated with increased cancer risk or if this is a normal finding, but most variants such as this get reclassified to being inconsequential. It should not be used to make medical management decisions. With time, we suspect the lab will determine the significance of this variant, if any. If we do learn more about it, we will try to contact Ms. Vollmer to discuss it further. However, it is important to stay in touch with [place] periodically and keep the address and phone number up to date.  Ms. Hearns reported that her mother may have been positive for a mutation in a BRCA gene but has not been able to locate a copy of the report.  We discussed that if her mother was positive for a mutation that was not detected in Ms. Donati testing, this would be considered a true negative.  ADDITIONAL GENETIC TESTING: We discussed with Ms. Vitali that her genetic testing was fairly extensive.  If there are genes identified to increase cancer risk that can be analyzed in the future, we would be happy to discuss and coordinate this testing at that time.    CANCER SCREENING RECOMMENDATIONS: Ms. Ramaker test result is considered negative (normal).  This means that we have not identified a known hereditary mutation in her.   While reassuring, this does not definitively rule out a hereditary predisposition to cancer. It is still possible that there could be genetic mutations that are undetectable by current technology. There could be genetic mutations in genes that have not been tested or identified to increase cancer risk.  Therefore, it is recommended she continue to follow the cancer management and screening guidelines provided by her primary healthcare provider.   An individual's cancer risk and medical management are not determined by genetic test results alone. Overall cancer  risk assessment incorporates additional factors, including personal medical history, family history, and any available genetic information that may result in a personalized plan for cancer prevention and surveillance  Based on Ms. Staten family of cancer, as well as her genetic test results, statistical models Harriett Rush)  and literature data were used to estimate her risk of developing breast cancer. These estimate her lifetime risk of developing breast cancer to be approximately 17%.  The patient's lifetime breast cancer risk is a preliminary estimate based on available information using one of several models endorsed by the [place] (ACS). The ACS recommends consideration of breast MRI screening as an adjunct to mammography for patients at high risk (defined as 20% or greater lifetime risk); thus, breast MRI screening is not recommended for Ms. Lamoreaux at this time based on available information.       RECOMMENDATIONS FOR FAMILY MEMBERS:  Individuals in this family might be at some increased risk of developing cancer, over the general population risk, simply due to the family history of cancer.  We recommended women in this family have a yearly mammogram beginning  at age 69, or 54 years younger than the earliest onset of cancer, an annual clinical breast exam, and perform monthly breast self-exams. Women in this family should also have a gynecological exam as recommended by their primary provider. Family members should be referred for colonoscopy starting at age 59 or has recommended by their physicians.   It is also possible there is a hereditary cause for the cancer in Ms. Mckissack family that she did not inherit and therefore was not identified in her.  Based on Ms. Wieting family history, we recommended her mother and paternal grandmother have genetic counseling and testing if they have not had updated testing already. Ms. Diluzio will let us know if we can be of any assistance in  coordinating genetic counseling and/or testing for this family member.   FOLLOW-UP: Lastly, we discussed with Ms. Bandel that cancer genetics is a rapidly advancing field and it is possible that new genetic tests will be appropriate for her and/or her family members in the future. We encouraged her to remain in contact with cancer genetics on an annual basis so we can update her personal and family histories and let her know of advances in cancer genetics that may benefit this family.   Our contact number was provided. Ms. Nielsen questions were answered to her satisfaction, and she knows she is welcome to call us at anytime with additional questions or concerns.     Albino Bufford M. Joette Catching, [place], [facility] Genetic Counselor [email or web address] (P) [phone]

## 2020-12-14 NOTE — Telephone Encounter (Signed)
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  Discussed that we do not know why there is cancer in the family. It could be due sporadic/familial, due to a change in a gene that she did not inherit, due to a different gene that we are not testing, or maybe our current technology may not be able to pick something up.  It will be important for her to keep in contact with genetics to keep up with whether additional testing may be needed.

## 2021-12-07 DIAGNOSIS — J209 Acute bronchitis, unspecified: Secondary | ICD-10-CM | POA: Diagnosis not present

## 2021-12-13 DIAGNOSIS — Z0000 Encounter for general adult medical examination without abnormal findings: Secondary | ICD-10-CM | POA: Diagnosis not present

## 2021-12-13 DIAGNOSIS — F339 Major depressive disorder, recurrent, unspecified: Secondary | ICD-10-CM | POA: Insufficient documentation

## 2021-12-13 DIAGNOSIS — F411 Generalized anxiety disorder: Secondary | ICD-10-CM | POA: Diagnosis not present

## 2021-12-13 DIAGNOSIS — Z1231 Encounter for screening mammogram for malignant neoplasm of breast: Secondary | ICD-10-CM | POA: Diagnosis not present

## 2021-12-13 DIAGNOSIS — Z13220 Encounter for screening for lipoid disorders: Secondary | ICD-10-CM | POA: Diagnosis not present

## 2021-12-13 DIAGNOSIS — J208 Acute bronchitis due to other specified organisms: Secondary | ICD-10-CM | POA: Diagnosis not present

## 2021-12-13 DIAGNOSIS — F325 Major depressive disorder, single episode, in full remission: Secondary | ICD-10-CM | POA: Insufficient documentation

## 2021-12-13 DIAGNOSIS — F330 Major depressive disorder, recurrent, mild: Secondary | ICD-10-CM | POA: Diagnosis not present

## 2022-02-04 ENCOUNTER — Ambulatory Visit (HOSPITAL_BASED_OUTPATIENT_CLINIC_OR_DEPARTMENT_OTHER): Payer: 59 | Admitting: Nurse Practitioner

## 2022-02-04 ENCOUNTER — Encounter (HOSPITAL_BASED_OUTPATIENT_CLINIC_OR_DEPARTMENT_OTHER): Payer: Self-pay | Admitting: Nurse Practitioner

## 2022-02-04 ENCOUNTER — Ambulatory Visit (INDEPENDENT_AMBULATORY_CARE_PROVIDER_SITE_OTHER): Payer: Self-pay | Admitting: Nurse Practitioner

## 2022-02-04 VITALS — BP 122/82 | HR 68 | Ht 63.0 in | Wt 260.0 lb

## 2022-02-04 DIAGNOSIS — F325 Major depressive disorder, single episode, in full remission: Secondary | ICD-10-CM

## 2022-02-04 DIAGNOSIS — Z6841 Body Mass Index (BMI) 40.0 and over, adult: Secondary | ICD-10-CM

## 2022-02-04 DIAGNOSIS — Z8249 Family history of ischemic heart disease and other diseases of the circulatory system: Secondary | ICD-10-CM

## 2022-02-04 MED ORDER — SEMAGLUTIDE-WEIGHT MANAGEMENT 0.5 MG/0.5ML ~~LOC~~ SOAJ: 0.5000 mg | SUBCUTANEOUS | 2 refills | Status: DC

## 2022-02-04 MED ORDER — ONDANSETRON HCL 8 MG PO TABS: 8.0000 mg | ORAL_TABLET | Freq: Three times a day (TID) | ORAL | 2 refills | Status: DC | PRN

## 2022-02-04 NOTE — Progress Notes (Signed)
Orma Render, DNP, AGNP-c Primary Care & Sports Medicine [address]  [place], [place] [address] [phone]  New patient visit   Patient: Rebecca Strickland   DOB: 01-05-1991   30 y.o. Female  MRN: [id number] Visit Date: 02/04/2022  Patient Care Team: Orma Render, NP as PCP - General (Nurse Practitioner)  Today's Vitals   02/04/22 1106  BP: 122/82  Pulse: 68  SpO2: 98%  Weight: 260 lb (117.9 kg)  Height: _0  (1.6 m)   Body mass index is 46.06 kg/m.   Today's healthcare provider: Orma Render, NP   Chief Complaint  Patient presents with   Establish Care   Obesity   Subjective    Rebecca Strickland is a 31 y.o. female who presents today as a new patient to establish care.    Patient endorses the following concerns presently: Weight Gain - since having her youngest child has not been able to get weight off.  - she feels like in the last 1-1.5 years it has worsened - she reports that she has tried multiple diet options and she can usually loose a few pounds but gains right back - she tells me her kids are very active and she is having trouble keeping up with them - she is making better choices with her diet and making separate meals from her family  - nothing she is trying at this time is helping with her weight - endorses frustration with the lack of results despite efforts made  History reviewed and reveals the following: Past Medical History:  Diagnosis Date   Family history of breast cancer 11/20/2020   Family history of ovarian cancer 11/20/2020   Migraine    Past Surgical History:  Procedure Laterality Date   NO PAST SURGERIES     Family Status  Relation Name Status   Mother  (Not Specified)   PGM  (Not Specified)   PGF  (Not Specified)   Other  (Not Specified)   Other  (Not Specified)   Other  (Not Specified)   Family History  Problem Relation Age of Onset   Breast cancer Mother 21       reported BRCA2 variant    Ovarian cancer Paternal Grandmother 37   Cancer Paternal Grandfather 16       sarcoma or lymphoma?   Breast cancer Other        MGM's sister; two primaries after age 19   Liver cancer Other        dx unknown age   Other Other        PGM's sister; brain tumor   Social History   Socioeconomic History   Marital status: Married    Spouse name: Not on file   Number of children: Not on file   Years of education: Not on file   Highest education level: Not on file  Occupational History   Not on file  Tobacco Use   Smoking status: Never   Smokeless tobacco: Never  Vaping Use   Vaping Use: Never used  Substance and Sexual Activity   Alcohol use: Yes    Comment: occasional   Drug use: No   Sexual activity: Yes    Birth control/protection: None  Other Topics Concern   Not on file  Social History Narrative   Not on file   Social Determinants of Health   Financial Resource Strain: Not on file  Food Insecurity: Not on file  Transportation Needs: Not on file  Physical Activity: Not on file  Stress: Not on file  Social Connections: Not on file   Outpatient Medications Prior to Visit  Medication Sig   escitalopram (LEXAPRO) 20 MG tablet Take 1 tablet (20 mg total) by mouth daily.   [DISCONTINUED] acetaminophen (TYLENOL) 325 MG tablet Take 325 mg by mouth every 6 (six) hours as needed for mild pain, moderate pain, fever or headache. (Patient not taking: Reported on 02/04/2022)   [DISCONTINUED] dicloxacillin (DYNAPEN) 500 MG capsule Take 1 capsule (500 mg total) by mouth every 6 (six) hours. (Patient not taking: Reported on 02/04/2022)   [DISCONTINUED] ibuprofen (ADVIL,MOTRIN) 600 MG tablet Take 1 tablet (600 mg total) by mouth every 8 (eight) hours as needed. (Patient not taking: Reported on 02/04/2022)   [DISCONTINUED] Prenatal Vit-Fe Fumarate-FA (PRENATAL MULTIVITAMIN) TABS tablet Take 1 tablet by mouth at bedtime.  (Patient not taking: Reported on 02/04/2022)   [DISCONTINUED]  sertraline (ZOLOFT) 50 MG tablet Take 50 mg by mouth at bedtime. (Patient not taking: Reported on 02/04/2022)   No facility-administered medications prior to visit.   Allergies  Allergen Reactions   Bee Venom Hives   Shellfish Allergy Hives and Nausea And Vomiting   Latex Rash   Immunization History  Administered Date(s) Administered   Tdap 02/02/2015    Review of Systems All review of systems negative except what is listed in the HPI   Objective    BP 122/82   Pulse 68   Ht _0  (1.6 m)   Wt 260 lb (117.9 kg)   SpO2 98%   BMI 46.06 kg/m  Physical Exam Vitals and nursing note reviewed.  Constitutional:      General: She is not in acute distress.    Appearance: Normal appearance. She is obese.  Eyes:     Extraocular Movements: Extraocular movements intact.     Conjunctiva/sclera: Conjunctivae normal.     Pupils: Pupils are equal, round, and reactive to light.  Neck:     Vascular: No carotid bruit.  Cardiovascular:     Rate and Rhythm: Normal rate and regular rhythm.     Pulses: Normal pulses.     Heart sounds: Normal heart sounds. No murmur heard. Pulmonary:     Effort: Pulmonary effort is normal.     Breath sounds: Normal breath sounds. No wheezing.  Abdominal:     General: Bowel sounds are normal.     Palpations: Abdomen is soft.  Musculoskeletal:        General: Normal range of motion.     Cervical back: Normal range of motion.     Right lower leg: No edema.     Left lower leg: No edema.  Skin:    General: Skin is warm and dry.     Capillary Refill: Capillary refill takes less than 2 seconds.  Neurological:     General: No focal deficit present.     Mental Status: She is alert and oriented to person, place, and time.  Psychiatric:        Mood and Affect: Mood normal.        Behavior: Behavior normal.        Thought Content: Thought content normal.        Judgment: Judgment normal.    No results found for any visits on 02/04/22.  Assessment & Plan       Problem List Items Addressed This Visit     Depression, major, single episode, complete remission ([place])  Chronic. Stable on current dose of lexapro. No alarm sx present today. Will continue with current treatment and monitor closely.  Refills appropriate as needed.        BMI 45.0-49.9, adult (HCC) - Primary    BMI 46.06 today. Patient appears well overall. Diet and exercise efforts have been unsuccessful in helping to loose excess body weight. Patient is following healthy diet habits and increasing activity. Discussion on ways to help stabilize glucose levels to help with diet efforts. Episodes of hypoglycemia in the past- I do wonder if she is having rebound hyperglycemia related to this affecting her ability to loose weight.  Will place CGM today for patient to monitor BG over the next 14 days to see if there are trends in hypoglycemia. Recommend consistent low carbohydrate meals with protein to ensure stability of BG and daily walking with goal of at least 150 min per day.  Given patients current BMI and family hx of CVD, I do feel that it is appropriate to begin treatment today with semaglutide to see if we can help with her weight loss efforts. We will also obtain labs today to ensure that there are no concerns for DM, HLD, and thyroid disorder.        Relevant Medications   Semaglutide-Weight Management 0.5 MG/0.5ML SOAJ (Start on 03/05/2022)   ondansetron (ZOFRAN) 8 MG tablet   Family hx of hypertension    BP stable today. Will continue to monitor in the setting of elevated BMI and strong family hx. Labs today to assess RF.          Return in about 3 months (around 05/07/2022) for virtual weight.      Jameila Keeny, Coralee Pesa, NP, DNP, AGNP-C Primary Care & Sports Medicine at [facility]

## 2022-02-04 NOTE — Patient Instructions (Addendum)
Thank you for choosing [facility] for your Primary Care needs. I am excited for the opportunity to partner with you to meet your health care goals. It was a pleasure meeting you today!  Recommendations from today's visit: I am going to send in the [facility] for you to try Try to keep your heart rate between 115-130 when you are walking to burn fat Keep your portion sizes small I have sent in zofran for you in case you get nausea Magnesium supplements can help if you have constipation (1000 mg) Try to keep your daily carbs about 180 grams or less Try to keep your saturated fats about 13g or less   Information on diet, exercise, and health maintenance recommendations are listed below. This is information to help you be sure you are on track for optimal health and monitoring.   Please look over this and let us know if you have any questions or if you have completed any of the health maintenance outside of [facility] so that we can be sure your records are up to date.  ___________________________________________________________ About Me: I am an Adult-Geriatric Nurse Practitioner with a background in caring for patients for more than 20 years with a strong intensive care background. I provide primary care and sports medicine services to patients age 42 and older within this office. My education had a strong focus on caring for the older adult population, which I am passionate about. I am also the director of the APP Fellowship with [facility].   My desire is to provide you with the best service through preventive medicine and supportive care. I consider you a part of the medical team and value your input. I work diligently to ensure that you are heard and your needs are met in a safe and effective manner. I want you to feel comfortable with me as your provider and want you to know that your health concerns are important to me.  For your information, our office  hours are: Monday, Tuesday, and Thursday 8:00 AM - 5:00 PM Wednesday and Friday 8:00 AM - 12:00 PM.   In my time away from the office I am teaching new APP's within the system and am unavailable, but my partner, Dr. Burnard Bunting is in the office for emergent needs.   If you have questions or concerns, please call our office at [phone] or send [place] a MyChart message and we will respond as quickly as possible.  ____________________________________________________________ MyChart:  For all urgent or time sensitive needs we ask that you please call the office to avoid delays. Our number is [phone]. MyChart is not constantly monitored and due to the large volume of messages a day, replies may take up to 72 business hours.  MyChart Policy: MyChart allows for you to see your visit notes, after visit summary, provider recommendations, lab and tests results, make an appointment, request refills, and contact your provider or the office for non-urgent questions or concerns. Providers are seeing patients during normal business hours and do not have built in time to review MyChart messages.  We ask that you allow a minimum of 3 business days for responses to [employer]. For this reason, please do not send urgent requests through [place]. Please call the office at [phone]. New and ongoing conditions may require a visit. We have virtual and in person visit available for your convenience.  Complex MyChart concerns may require a visit. Your provider may request you schedule a  virtual or in person visit to ensure we are providing the best care possible. MyChart messages sent after 11:00 AM on Friday will not be received by the provider until Monday morning.    Lab and Test Results: You will receive your lab and test results on MyChart as soon as they are completed and results have been sent by the lab or testing facility. Due to this service, you will receive your results BEFORE your provider.  I  review lab and tests results each morning prior to seeing patients. Some results require collaboration with other providers to ensure you are receiving the most appropriate care. For this reason, we ask that you please allow a minimum of 3-5 business days from the time the ALL results have been received for your provider to receive and review lab and test results and contact you about these.  Most lab and test result comments from the provider will be sent through [place]. Your provider may recommend changes to the plan of care, follow-up visits, repeat testing, ask questions, or request an office visit to discuss these results. You may reply directly to this message or call the office at [phone] to provide information for the provider or set up an appointment. In some instances, you will be called with test results and recommendations. Please let us know if this is preferred and we will make note of this in your chart to provide this for you.    If you have not heard a response to your lab or test results in 5 business days from all results returning to [place], please call the office to let us know. We ask that you please avoid calling prior to this time unless there is an emergent concern. Due to high call volumes, this can delay the resulting process.  After Hours: For all non-emergency after hours needs, please call the office at [phone] and select the option to reach the on-call provider service. On-call services are shared between multiple [place] offices and therefore it will not be possible to speak directly with your provider. On-call providers may provide medical advice and recommendations, but are unable to provide refills for maintenance medications.  For all emergency or urgent medical needs after normal business hours, we recommend that you seek care at the closest Urgent Care or Emergency Department to ensure appropriate treatment in a timely manner.  [facility] at  [facility] [place] has a 24 hour emergency room located on the ground floor for your convenience.   Urgent Concerns During the Business Day Providers are seeing patients from 8AM to [place] with a busy schedule and are most often not able to respond to non-urgent calls until the end of the day or the next business day. If you should have URGENT concerns during the day, please call and speak to the nurse or schedule a same day appointment so that we can address your concern without delay.   Thank you, again, for choosing me as your health care partner. I appreciate your trust and look forward to learning more about you.   [name], DNP, AGNP-c ___________________________________________________________  Health Maintenance Recommendations Screening Testing Mammogram Every 1 -2 years based on history and risk factors Starting at age 45 Pap Smear Ages 21-39 every 3 years Ages 37-65 every 5 years with HPV testing More frequent testing may be required based on results and history Colon Cancer Screening Every 1-10 years based on test performed, risk factors, and history Starting at age 58 Bone Density Screening  Every 2-10 years based on history Starting at age 41 for women Recommendations for men differ based on medication usage, history, and risk factors AAA Screening One time ultrasound Men 24-76 years old who have every smoked Lung Cancer Screening Low Dose Lung CT every 12 months Age 25-80 years with a 30 pack-year smoking history who still smoke or who have quit within the last 15 years  Screening Labs Routine  Labs: Complete Blood Count (CBC), Complete Metabolic Panel (CMP), Cholesterol (Lipid Panel) Every 6-12 months based on history and medications May be recommended more frequently based on current conditions or previous results Hemoglobin A1c Lab Every 3-12 months based on history and previous results Starting at age 66 or earlier with diagnosis of diabetes, high cholesterol, BMI  >26, and/or risk factors Frequent monitoring for patients with diabetes to ensure blood sugar control Thyroid Panel (TSH w/ T3 & T4) Every 6 months based on history, symptoms, and risk factors May be repeated more often if on medication HIV One time testing for all patients 71 and older May be repeated more frequently for patients with increased risk factors or exposure Hepatitis C One time testing for all patients 48 and older May be repeated more frequently for patients with increased risk factors or exposure Gonorrhea, Chlamydia Every 12 months for all sexually active persons 13-24 years Additional monitoring may be recommended for those who are considered high risk or who have symptoms PSA Men 31-67 years old with risk factors Additional screening may be recommended from age 108-69 based on risk factors, symptoms, and history  Vaccine Recommendations Tetanus Booster All adults every 10 years Flu Vaccine All patients 6 months and older every year COVID Vaccine All patients 12 years and older Initial dosing with booster May recommend additional booster based on age and health history HPV Vaccine 2 doses all patients age 57-26 Dosing may be considered for patients over 26 Shingles Vaccine (Shingrix) 2 doses all adults 24 years and older Pneumonia (Pneumovax 23) All adults 14 years and older May recommend earlier dosing based on health history Pneumonia (Prevnar 17) All adults 49 years and older Dosed 1 year after Pneumovax 23  Additional Screening, Testing, and Vaccinations may be recommended on an individualized basis based on family history, health history, risk factors, and/or exposure.  __________________________________________________________  Diet Recommendations for All Patients  I recommend that all patients maintain a diet low in saturated fats, carbohydrates, and cholesterol. While this can be challenging at first, it is not impossible and small changes can make  big differences.  Things to try: Decreasing the amount of soda, sweet tea, and/or juice to one or less per day and replace with water While water is always the first choice, if you do not like water you may consider adding a water additive without sugar to improve the taste other sugar free drinks Replace potatoes with a brightly colored vegetable at dinner Use healthy oils, such as canola oil or olive oil, instead of butter or hard margarine Limit your bread intake to two pieces or less a day Replace regular pasta with low carb pasta options Bake, broil, or grill foods instead of frying Monitor portion sizes  Eat smaller, more frequent meals throughout the day instead of large meals  An important thing to remember is, if you love foods that are not great for your health, you don't have to give them up completely. Instead, allow these foods to be a reward when you have done well. Allowing yourself to still  have special treats every once in a while is a nice way to tell yourself thank you for working hard to keep yourself healthy.   Also remember that every day is a new day. If you have a bad day and "fall off the wagon", you can still climb right back up and keep moving along on your journey!  We have resources available to help you!  Some websites that may be helpful include: www.http://carter.biz/  Www.VeryWellFit.com _____________________________________________________________  Activity Recommendations for All Patients  I recommend that all adults get at least 20 minutes of moderate physical activity that elevates your heart rate at least 5 days out of the week.  Some examples include: Walking or jogging at a pace that allows you to carry on a conversation Cycling (stationary bike or outdoors) Water aerobics Yoga Weight lifting Dancing If physical limitations prevent you from putting stress on your joints, exercise in a pool or seated in a chair are excellent options.  Do determine  your MAXIMUM heart rate for activity: YOUR AGE - 220 = MAX HeartRate   Remember! Do not push yourself too hard.  Start slowly and build up your pace, speed, weight, time in exercise, etc.  Allow your body to rest between exercise and get good sleep. You will need more water than normal when you are exerting yourself. Do not wait until you are thirsty to drink. Drink with a purpose of getting in at least 8, 8 ounce glasses of water a day plus more depending on how much you exercise and sweat.    If you begin to develop dizziness, chest pain, abdominal pain, jaw pain, shortness of breath, headache, vision changes, lightheadedness, or other concerning symptoms, stop the activity and allow your body to rest. If your symptoms are severe, seek emergency evaluation immediately. If your symptoms are concerning, but not severe, please let us know so that we can recommend further evaluation.

## 2022-02-09 ENCOUNTER — Telehealth (HOSPITAL_BASED_OUTPATIENT_CLINIC_OR_DEPARTMENT_OTHER): Payer: Self-pay

## 2022-02-09 ENCOUNTER — Telehealth (HOSPITAL_BASED_OUTPATIENT_CLINIC_OR_DEPARTMENT_OTHER): Payer: Self-pay | Admitting: Nurse Practitioner

## 2022-02-09 DIAGNOSIS — Z6841 Body Mass Index (BMI) 40.0 and over, adult: Secondary | ICD-10-CM | POA: Insufficient documentation

## 2022-02-09 DIAGNOSIS — Z8249 Family history of ischemic heart disease and other diseases of the circulatory system: Secondary | ICD-10-CM | POA: Insufficient documentation

## 2022-02-09 NOTE — Assessment & Plan Note (Signed)
BP stable today. Will continue to monitor in the setting of elevated BMI and strong family hx. Labs today to assess RF.

## 2022-02-09 NOTE — Telephone Encounter (Signed)
Appeal was sent through cover my meds for [facility].

## 2022-02-09 NOTE — Assessment & Plan Note (Signed)
Chronic. Stable on current dose of lexapro. No alarm sx present today. Will continue with current treatment and monitor closely.  Refills appropriate as needed.

## 2022-02-09 NOTE — Telephone Encounter (Signed)
Received a PA Appeal for Rebecca Strickland. Paperwork is provider RED dot Tray for her to fill out. Please advise.

## 2022-02-09 NOTE — Assessment & Plan Note (Signed)
BMI 46.06 today. Patient appears well overall. Diet and exercise efforts have been unsuccessful in helping to loose excess body weight. Patient is following healthy diet habits and increasing activity. Discussion on ways to help stabilize glucose levels to help with diet efforts. Episodes of hypoglycemia in the past- I do wonder if she is having rebound hyperglycemia related to this affecting her ability to loose weight.  Will place CGM today for patient to monitor BG over the next 14 days to see if there are trends in hypoglycemia. Recommend consistent low carbohydrate meals with protein to ensure stability of BG and daily walking with goal of at least 150 min per day.  Given patients current BMI and family hx of CVD, I do feel that it is appropriate to begin treatment today with semaglutide to see if we can help with her weight loss efforts. We will also obtain labs today to ensure that there are no concerns for DM, HLD, and thyroid disorder.

## 2022-02-10 NOTE — Telephone Encounter (Signed)
Received another 20 pg appeal paperwork. Appeal is in provider yellow tray. Please advise.

## 2022-02-16 ENCOUNTER — Telehealth (HOSPITAL_BASED_OUTPATIENT_CLINIC_OR_DEPARTMENT_OTHER): Payer: Self-pay

## 2022-02-16 NOTE — Telephone Encounter (Signed)
Spoke with Lanelle Bal at [place], got a fax number for appeal. I sent the appeal to [phone] For the [facility].

## 2022-03-04 ENCOUNTER — Telehealth (HOSPITAL_BASED_OUTPATIENT_CLINIC_OR_DEPARTMENT_OTHER): Payer: Self-pay | Admitting: Nurse Practitioner

## 2022-03-04 NOTE — Telephone Encounter (Signed)
Pt called regarding about to run out of Wegovy on Sunday 6/18 and PA is still in progress. Pt is wondering on what she should do. Please advise.

## 2022-03-10 ENCOUNTER — Telehealth (HOSPITAL_BASED_OUTPATIENT_CLINIC_OR_DEPARTMENT_OTHER): Payer: Self-pay

## 2022-03-10 NOTE — Telephone Encounter (Signed)
Spoke to patient in great detail regarding Wegovy. He insurance denied the first prior [place], she came by the office and signed paper work to appeal the decision. The appeal was faxed to her insurance company. I called her insurance company @ [phone] and spoke with Jomarie Longs the appeal was denied as well with a reference number # [id number]. I gave all this information to the patient she will call her insurance and see what medications are covered and will call back with the next plan of action.

## 2022-03-11 ENCOUNTER — Telehealth (HOSPITAL_BASED_OUTPATIENT_CLINIC_OR_DEPARTMENT_OTHER): Payer: Self-pay

## 2022-03-11 DIAGNOSIS — Z6841 Body Mass Index (BMI) 40.0 and over, adult: Secondary | ICD-10-CM

## 2022-03-14 ENCOUNTER — Telehealth (HOSPITAL_BASED_OUTPATIENT_CLINIC_OR_DEPARTMENT_OTHER): Payer: Self-pay

## 2022-03-14 MED ORDER — PHENTERMINE HCL 37.5 MG PO CAPS: ORAL_CAPSULE | ORAL | 2 refills | Status: DC

## 2022-05-09 ENCOUNTER — Encounter (HOSPITAL_BASED_OUTPATIENT_CLINIC_OR_DEPARTMENT_OTHER): Payer: Self-pay | Admitting: Nurse Practitioner

## 2022-05-09 ENCOUNTER — Ambulatory Visit (INDEPENDENT_AMBULATORY_CARE_PROVIDER_SITE_OTHER): Payer: BC Managed Care – PPO | Admitting: Nurse Practitioner

## 2022-05-09 DIAGNOSIS — F339 Major depressive disorder, recurrent, unspecified: Secondary | ICD-10-CM | POA: Diagnosis not present

## 2022-05-09 DIAGNOSIS — Z6841 Body Mass Index (BMI) 40.0 and over, adult: Secondary | ICD-10-CM | POA: Diagnosis not present

## 2022-05-09 DIAGNOSIS — N926 Irregular menstruation, unspecified: Secondary | ICD-10-CM | POA: Diagnosis not present

## 2022-05-09 DIAGNOSIS — F411 Generalized anxiety disorder: Secondary | ICD-10-CM

## 2022-05-09 NOTE — Assessment & Plan Note (Signed)
Significant increase in anxiety symptoms with use of phentermine.  These have since resolved now that phentermine has been stopped.  We will plan to monitor mood closely to ensure that there are no further variations present.  No alarm symptoms present at this time.

## 2022-05-09 NOTE — Assessment & Plan Note (Signed)
Irregular menses of unknown etiology.  Patient did miss her cycle for approximately 3 months but then had one normal cycle.  This has happened in the past however it was after the birth of her son.  Prior to this she has always had regular menstrual periods with no concerns.  Discussion with patient today on monitoring labs to determine if there are oral hormonal abnormalities that could be contributing.  Given her weight gain and altered menstrual cycle it is possible that there is some thyroid dysfunction going on.  We will follow-up once we have lab results back.

## 2022-05-09 NOTE — Patient Instructions (Signed)
Once we have your lab results back I will be in touch and we can come up with a plan.  Please let me know if your anxiety seems to get any worse

## 2022-05-09 NOTE — Progress Notes (Signed)
Virtual Visit Encounter telephone visit.   I connected with  Rebecca Strickland on 05/09/22 at 10:10 AM EDT by secure audio telemedicine application. I verified that I am speaking with the correct person using two identifiers.   I introduced myself as a Publishing rights manager with the practice. The limitations of evaluation and management by telemedicine discussed with the patient and the availability of in person appointments. The patient expressed verbal understanding and consent to proceed.  Participating parties in this visit include: Myself and patient  The patient is: Patient Location: Home I am: Provider Location: Office/Clinic Subjective:    CC and HPI: Rebecca Strickland is a 31 y.o. year old female presenting for follow up of weight management. Patient reports the following: Phentermine  She tells me that when she started taking the medication she starting feeling "weird" and "shaky". She was having panic attacks and shaking so badly she could not hold silverware to eat by the end of the 30 days. She tells me that she has not picked up the refills of the medication because of the side effects. She tells me that these symptoms have stopped since she stopped the medication.   She did try the [facility] and had success, but her insurance would not cover this. She tells me that her insurance does not cover any weight loss medications.   She is an emotional eater and tends to eat more when her anxiety is exacerbated. She is very frustrated with her lack of success with phentermine and that her insurance will not cover any other medications.   Menstrual Cycle  She is telling me that she missed her period and did not have a cycle for 100 days and then her period did come. She has always had normal periods with the exception of a year after her she had her son. She tells me the she did not have a period for an entire year starting when he was about 1 and eventually she had to start medication to have a  period. She is not sure if this is hormonal or something else going on. She also tells me that while she did not have a period she was having cramping and increased acne.   Mood  She tells me her lack of weight management has been very hard on her mental health. She reports that this weighs on her heavily and she is frustrated and upset over her lack of success.   Past medical history, Surgical history, Family history not pertinant except as noted below, Social history, Allergies, and medications have been entered into the medical record, reviewed, and corrections made.   Review of Systems:  All review of systems negative except what is listed in the HPI  Objective:    Alert and oriented x 4 Speaking in clear sentences with no shortness of breath. No distress.  Impression and Recommendations:    Problem List Items Addressed This Visit     Generalized anxiety disorder    Significant increase in anxiety symptoms with use of phentermine.  These have since resolved now that phentermine has been stopped.  We will plan to monitor mood closely to ensure that there are no further variations present.  No alarm symptoms present at this time.      BMI 45.0-49.9, adult (HCC) - Primary    Unfortunately Rebecca Strickland was unable to tolerate the side effects of phentermine for weight management.  She has had success on Wegovy in the past however her insurance does not  cover this or any weight loss medications therefore this is not a current option.  She has not had any recent labs.  I did discuss with the patient the option of getting labs and following up based on these findings to determine what medication options may be most beneficial for her.  I do not feel she would be an appropriate candidate for bupropion as she does have the significant underlying anxiety symptoms.  She may be able to tolerate a lower dose of phentermine however I would like to check labs to make sure there is nothing contributing to her  weight gain prior to starting this.  We will plan to follow-up once we have lab results.      Relevant Orders   Hemoglobin A1c   Lipid panel   CBC With Diff/Platelet   Comprehensive metabolic panel   VITAMIN D 25 Hydroxy (Vit-D Deficiency, Fractures)   Thyroid Panel With TSH   17-Hydroxyprogesterone   Prolactin   Testosterone, Free, Total, SHBG   Menstrual abnormality    Irregular menses of unknown etiology.  Patient did miss her cycle for approximately 3 months but then had one normal cycle.  This has happened in the past however it was after the birth of her son.  Prior to this she has always had regular menstrual periods with no concerns.  Discussion with patient today on monitoring labs to determine if there are oral hormonal abnormalities that could be contributing.  Given her weight gain and altered menstrual cycle it is possible that there is some thyroid dysfunction going on.  We will follow-up once we have lab results back.      Relevant Orders   Hemoglobin A1c   Lipid panel   CBC With Diff/Platelet   Comprehensive metabolic panel   VITAMIN D 25 Hydroxy (Vit-D Deficiency, Fractures)   Thyroid Panel With TSH   17-Hydroxyprogesterone   Prolactin   Testosterone, Free, Total, SHBG   Other Visit Diagnoses     Depression, recurrent (HCC)           orders and follow up as documented in EMR I discussed the assessment and treatment plan with the patient. The patient was provided an opportunity to ask questions and all were answered. The patient agreed with the plan and demonstrated an understanding of the instructions.   The patient was advised to call back or seek an in-person evaluation if the symptoms worsen or if the condition fails to improve as anticipated.  Follow-Up: after labs and/or imaging, consults, etc. have been completed  I provided 26 minutes of non-face-to-face interaction with this non face-to-face encounter including intake, same-day documentation, and  chart review.   Tollie Eth, NP , DNP, AGNP-c [facility] Primary [facility] at [facility] [phone] (fax)

## 2022-05-09 NOTE — Assessment & Plan Note (Signed)
Unfortunately Rebecca Strickland was unable to tolerate the side effects of phentermine for weight management.  She has had success on Wegovy in the past however her insurance does not cover this or any weight loss medications therefore this is not a current option.  She has not had any recent labs.  I did discuss with the patient the option of getting labs and following up based on these findings to determine what medication options may be most beneficial for her.  I do not feel she would be an appropriate candidate for bupropion as she does have the significant underlying anxiety symptoms.  She may be able to tolerate a lower dose of phentermine however I would like to check labs to make sure there is nothing contributing to her weight gain prior to starting this.  We will plan to follow-up once we have lab results.

## 2022-05-16 ENCOUNTER — Telehealth (HOSPITAL_BASED_OUTPATIENT_CLINIC_OR_DEPARTMENT_OTHER): Payer: Self-pay

## 2022-05-16 NOTE — Telephone Encounter (Signed)
Patient has is having menstrual issues and would like to discuss with you. Please advise.

## 2022-05-17 LAB — LIPID PANEL
Chol/HDL Ratio: 5.1 ratio — ABNORMAL HIGH (ref 0.0–4.4)
Cholesterol, Total: 198 mg/dL (ref 100–199)
HDL: 39 mg/dL — ABNORMAL LOW (ref >39)
LDL Chol Calc (NIH): 114 mg/dL — ABNORMAL HIGH (ref 0–99)
Triglycerides: 258 mg/dL — ABNORMAL HIGH (ref 0–149)
VLDL Cholesterol Cal: 45 mg/dL — ABNORMAL HIGH (ref 5–40)

## 2022-05-17 LAB — COMPREHENSIVE METABOLIC PANEL
ALT: 65 IU/L — ABNORMAL HIGH (ref 0–32)
AST: 32 IU/L (ref 0–40)
Albumin/Globulin Ratio: 2 (ref 1.2–2.2)
Albumin: 4.5 g/dL (ref 4.0–5.0)
Alkaline Phosphatase: 85 IU/L (ref 44–121)
BUN/Creatinine Ratio: 11 (ref 9–23)
BUN: 9 mg/dL (ref 6–20)
Bilirubin Total: 0.4 mg/dL (ref 0.0–1.2)
CO2: 22 mmol/L (ref 20–29)
Calcium: 9.4 mg/dL (ref 8.7–10.2)
Chloride: 99 mmol/L (ref 96–106)
Creatinine, Ser: 0.83 mg/dL (ref 0.57–1.00)
Globulin, Total: 2.3 g/dL (ref 1.5–4.5)
Glucose: 109 mg/dL — ABNORMAL HIGH (ref 70–99)
Potassium: 4.2 mmol/L (ref 3.5–5.2)
Sodium: 136 mmol/L (ref 134–144)
Total Protein: 6.8 g/dL (ref 6.0–8.5)
eGFR: 97 mL/min/{1.73_m2} (ref >59)

## 2022-05-17 LAB — HEMOGLOBIN A1C
Est. average glucose Bld gHb Est-mCnc: 111 mg/dL
Hgb A1c MFr Bld: 5.5 % (ref 4.8–5.6)

## 2022-05-17 LAB — CBC WITH DIFF/PLATELET
Basophils Absolute: 0 10*3/uL (ref 0.0–0.2)
Basos: 0 %
EOS (ABSOLUTE): 0.1 10*3/uL (ref 0.0–0.4)
Eos: 2 %
Hematocrit: 43.2 % (ref 34.0–46.6)
Hemoglobin: 14.2 g/dL (ref 11.1–15.9)
Immature Grans (Abs): 0 10*3/uL (ref 0.0–0.1)
Immature Granulocytes: 0 %
Lymphocytes Absolute: 1.6 10*3/uL (ref 0.7–3.1)
Lymphs: 20 %
MCH: 30.6 pg (ref 26.6–33.0)
MCHC: 32.9 g/dL (ref 31.5–35.7)
MCV: 93 fL (ref 79–97)
Monocytes Absolute: 0.6 10*3/uL (ref 0.1–0.9)
Monocytes: 7 %
Neutrophils Absolute: 5.6 10*3/uL (ref 1.4–7.0)
Neutrophils: 71 %
Platelets: 260 10*3/uL (ref 150–450)
RBC: 4.64 x10E6/uL (ref 3.77–5.28)
RDW: 13 % (ref 11.7–15.4)
WBC: 7.9 10*3/uL (ref 3.4–10.8)

## 2022-05-17 LAB — THYROID PANEL WITH TSH
Free Thyroxine Index: 1.5 (ref 1.2–4.9)
T3 Uptake Ratio: 23 % — ABNORMAL LOW (ref 24–39)
T4, Total: 6.6 ug/dL (ref 4.5–12.0)
TSH: 1.1 u[IU]/mL (ref 0.450–4.500)

## 2022-05-17 LAB — TESTOSTERONE, FREE, TOTAL, SHBG
Sex Hormone Binding: 23.3 nmol/L — ABNORMAL LOW (ref 24.6–122.0)
Testosterone, Free: 0.4 pg/mL (ref 0.0–4.2)
Testosterone: 14 ng/dL (ref 13–71)

## 2022-05-17 LAB — PROLACTIN: Prolactin: 15.4 ng/mL (ref 4.8–23.3)

## 2022-05-17 LAB — 17-HYDROXYPROGESTERONE: 17-OH Progesterone LCMS: 139 ng/dL

## 2022-05-17 LAB — VITAMIN D 25 HYDROXY (VIT D DEFICIENCY, FRACTURES): Vit D, 25-Hydroxy: 22.9 ng/mL — ABNORMAL LOW (ref 30.0–100.0)

## 2022-05-18 ENCOUNTER — Ambulatory Visit (INDEPENDENT_AMBULATORY_CARE_PROVIDER_SITE_OTHER): Payer: BC Managed Care – PPO | Admitting: Nurse Practitioner

## 2022-05-18 ENCOUNTER — Encounter (HOSPITAL_BASED_OUTPATIENT_CLINIC_OR_DEPARTMENT_OTHER): Payer: Self-pay | Admitting: Nurse Practitioner

## 2022-05-18 DIAGNOSIS — R7401 Elevation of levels of liver transaminase levels: Secondary | ICD-10-CM

## 2022-05-18 DIAGNOSIS — R7301 Impaired fasting glucose: Secondary | ICD-10-CM

## 2022-05-18 DIAGNOSIS — E782 Mixed hyperlipidemia: Secondary | ICD-10-CM | POA: Diagnosis not present

## 2022-05-18 DIAGNOSIS — N926 Irregular menstruation, unspecified: Secondary | ICD-10-CM

## 2022-05-18 DIAGNOSIS — Z6841 Body Mass Index (BMI) 40.0 and over, adult: Secondary | ICD-10-CM

## 2022-05-18 NOTE — Progress Notes (Signed)
Virtual Visit Encounter telephone visit.   I connected with  Rebecca Strickland on 06/18/22 at 10:30 AM EDT by secure audio telemedicine application. I verified that I am speaking with the correct person using two identifiers.   I introduced myself as a Publishing rights manager with the practice. The limitations of evaluation and management by telemedicine discussed with the patient and the availability of in person appointments. The patient expressed verbal understanding and consent to proceed.  Participating parties in this visit include: Myself and patient  The patient is: Patient Location: Home I am: Provider Location: Office/Clinic Subjective:    CC and HPI: Rebecca Strickland is a 31 y.o. year old female presenting for new evaluation and treatment of menstrual irregularity. Patient reports the following: Abnormal menses Annina reports recently having a missed period with more than 100 days without any vaginal bleeding.  She reports that she did finally start her period and this did have more cramping than normal. She tells me that 21 days later she started her period again, this time with intense cramping the day prior to bleeding.  She typically has very regular menstrual cycles consistently 26 to 28 days in length.  The only time she has had an abnormal cycle is for about a year after the birth of her son where she did have several missed menses.  She recently had lab work completed for hormone levels.  She also would like to see if insurance will cover for ozempic given the recent lab results.   Past medical history, Surgical history, Family history not pertinant except as noted below, Social history, Allergies, and medications have been entered into the medical record, reviewed, and corrections made.   Review of Systems:  All review of systems negative except what is listed in the HPI  Objective:    Alert and oriented x 4 Speaking in clear sentences with no shortness of breath. No  distress.  Impression and Recommendations:    Problem List Items Addressed This Visit     BMI 45.0-49.9, adult (HCC) - Primary    Elevated BMI in the setting of elevated fasting blood glucose, HLD, and elevated liver enzymes. Will see if we can get ozempic covered for management of all issues. She has been unable to tolerate phentermine due to increased palpitations and anxiety.  She has been working on diet and exercise for weight management and has not been able to see any consistent changes with these factors alone.  I do suspect that her elevated blood glucose levels are making the weight loss more challenging.  Hopefully with improved control she will have more success.      Relevant Medications   Semaglutide,0.25 or 0.5MG /DOS, (OZEMPIC, 0.25 OR 0.5 MG/DOSE,) 2 MG/3ML SOPN   Menstrual abnormality    Repeat of irregularities with menses. Recent testosterone levels on the low end of normal. This change likely triggered the missed menses. We will monitor closely for the next few months to see if these normalize. May need to consider additional testing in a 2-3 months if this continues to be cyclical.       Mixed hyperlipidemia   Relevant Medications   Semaglutide,0.25 or 0.5MG /DOS, (OZEMPIC, 0.25 OR 0.5 MG/DOSE,) 2 MG/3ML SOPN   Elevated fasting glucose    Recent labs show elevated blood glucose levels, hyperlipidemia, and elevated liver enzymes. At this time I do feel that weight management would be helpful for her as well as increased control of her blood sugars for risk reduction. We  have discussed options and she would like to see if she is able to get Ozempic covered for control of blood sugar, weight, and lipids. We will send this medication to the pharmacy and likely need PA authorization. Will monitor.       Relevant Medications   Semaglutide,0.25 or 0.5MG /DOS, (OZEMPIC, 0.25 OR 0.5 MG/DOSE,) 2 MG/3ML SOPN   ALT (SGPT) level raised   Relevant Medications   Semaglutide,0.25 or  0.5MG /DOS, (OZEMPIC, 0.25 OR 0.5 MG/DOSE,) 2 MG/3ML SOPN    orders and follow up as documented in EMR I discussed the assessment and treatment plan with the patient. The patient was provided an opportunity to ask questions and all were answered. The patient agreed with the plan and demonstrated an understanding of the instructions.   The patient was advised to call back or seek an in-person evaluation if the symptoms worsen or if the condition fails to improve as anticipated.  Follow-Up: in 3 months  I provided 26 minutes of non-face-to-face interaction with this non face-to-face encounter including intake, same-day documentation, and chart review.   Tollie Eth, NP , DNP, AGNP-c [facility] at [facility] [phone] (fax)

## 2022-05-18 NOTE — Telephone Encounter (Signed)
Done

## 2022-05-29 ENCOUNTER — Encounter (HOSPITAL_BASED_OUTPATIENT_CLINIC_OR_DEPARTMENT_OTHER): Payer: Self-pay | Admitting: Nurse Practitioner

## 2022-06-02 ENCOUNTER — Other Ambulatory Visit (HOSPITAL_BASED_OUTPATIENT_CLINIC_OR_DEPARTMENT_OTHER): Payer: Self-pay

## 2022-06-02 DIAGNOSIS — E559 Vitamin D deficiency, unspecified: Secondary | ICD-10-CM

## 2022-06-02 MED ORDER — VITAMIN D (ERGOCALCIFEROL) 1.25 MG (50000 UNIT) PO CAPS: 50000.0000 [IU] | ORAL_CAPSULE | ORAL | 0 refills | Status: DC

## 2022-06-18 DIAGNOSIS — R7301 Impaired fasting glucose: Secondary | ICD-10-CM | POA: Insufficient documentation

## 2022-06-18 DIAGNOSIS — R7401 Elevation of levels of liver transaminase levels: Secondary | ICD-10-CM | POA: Insufficient documentation

## 2022-06-18 DIAGNOSIS — E782 Mixed hyperlipidemia: Secondary | ICD-10-CM | POA: Insufficient documentation

## 2022-06-18 MED ORDER — OZEMPIC (0.25 OR 0.5 MG/DOSE) 2 MG/3ML ~~LOC~~ SOPN: PEN_INJECTOR | SUBCUTANEOUS | 0 refills | Status: DC

## 2022-06-18 NOTE — Assessment & Plan Note (Signed)
Elevated BMI in the setting of elevated fasting blood glucose, HLD, and elevated liver enzymes. Will see if we can get ozempic covered for management of all issues. She has been unable to tolerate phentermine due to increased palpitations and anxiety.  She has been working on diet and exercise for weight management and has not been able to see any consistent changes with these factors alone.  I do suspect that her elevated blood glucose levels are making the weight loss more challenging.  Hopefully with improved control she will have more success.

## 2022-06-18 NOTE — Assessment & Plan Note (Signed)
Recent labs show elevated blood glucose levels, hyperlipidemia, and elevated liver enzymes. At this time I do feel that weight management would be helpful for her as well as increased control of her blood sugars for risk reduction. We have discussed options and she would like to see if she is able to get Ozempic covered for control of blood sugar, weight, and lipids. We will send this medication to the pharmacy and likely need PA authorization. Will monitor.

## 2022-06-18 NOTE — Assessment & Plan Note (Signed)
Repeat of irregularities with menses. Recent testosterone levels on the low end of normal. This change likely triggered the missed menses. We will monitor closely for the next few months to see if these normalize. May need to consider additional testing in a 2-3 months if this continues to be cyclical.

## 2022-08-29 ENCOUNTER — Ambulatory Visit (HOSPITAL_BASED_OUTPATIENT_CLINIC_OR_DEPARTMENT_OTHER): Payer: BC Managed Care – PPO | Admitting: Nurse Practitioner

## 2022-11-02 ENCOUNTER — Encounter: Payer: Self-pay | Admitting: Nurse Practitioner

## 2022-11-02 ENCOUNTER — Ambulatory Visit: Payer: BC Managed Care – PPO | Admitting: Nurse Practitioner

## 2022-11-02 VITALS — BP 126/80 | HR 82 | Ht 64.75 in | Wt 260.6 lb

## 2022-11-02 DIAGNOSIS — R7301 Impaired fasting glucose: Secondary | ICD-10-CM

## 2022-11-02 DIAGNOSIS — F339 Major depressive disorder, recurrent, unspecified: Secondary | ICD-10-CM

## 2022-11-02 DIAGNOSIS — F325 Major depressive disorder, single episode, in full remission: Secondary | ICD-10-CM

## 2022-11-02 DIAGNOSIS — K219 Gastro-esophageal reflux disease without esophagitis: Secondary | ICD-10-CM | POA: Diagnosis not present

## 2022-11-02 DIAGNOSIS — Z0000 Encounter for general adult medical examination without abnormal findings: Secondary | ICD-10-CM | POA: Insufficient documentation

## 2022-11-02 DIAGNOSIS — Z1159 Encounter for screening for other viral diseases: Secondary | ICD-10-CM

## 2022-11-02 DIAGNOSIS — Z6841 Body Mass Index (BMI) 40.0 and over, adult: Secondary | ICD-10-CM

## 2022-11-02 DIAGNOSIS — Z8249 Family history of ischemic heart disease and other diseases of the circulatory system: Secondary | ICD-10-CM

## 2022-11-02 DIAGNOSIS — E559 Vitamin D deficiency, unspecified: Secondary | ICD-10-CM | POA: Insufficient documentation

## 2022-11-02 DIAGNOSIS — T753XXA Motion sickness, initial encounter: Secondary | ICD-10-CM

## 2022-11-02 DIAGNOSIS — R7401 Elevation of levels of liver transaminase levels: Secondary | ICD-10-CM | POA: Diagnosis not present

## 2022-11-02 DIAGNOSIS — E782 Mixed hyperlipidemia: Secondary | ICD-10-CM

## 2022-11-02 MED ORDER — SCOPOLAMINE 1 MG/3DAYS TD PT72: 1.0000 | MEDICATED_PATCH | TRANSDERMAL | 12 refills | Status: DC

## 2022-11-02 MED ORDER — PANTOPRAZOLE SODIUM 40 MG PO TBEC: 40.0000 mg | DELAYED_RELEASE_TABLET | Freq: Every day | ORAL | 3 refills | Status: DC

## 2022-11-02 MED ORDER — ESCITALOPRAM OXALATE 20 MG PO TABS: 20.0000 mg | ORAL_TABLET | Freq: Every day | ORAL | 3 refills | Status: DC

## 2022-11-02 NOTE — Assessment & Plan Note (Signed)
No symptoms at this time.  Plan: - Repeat labs today for monitoring. - Encouraged weight management to help reduce the risk of fatty liver.

## 2022-11-02 NOTE — Progress Notes (Signed)
Rebecca Keeler, DNP, AGNP-c [place], [place] [address] Main Office [phone]  BP 126/80   Pulse 82   Ht 5' 4.75" (1.645 m)   Wt 260 lb 9.6 oz (118.2 kg)   LMP 10/31/2022   BMI 43.70 kg/m    Subjective:    Patient ID: Rebecca Strickland, female    DOB: [birth year], 32 y.o.   MRN: [id number]  HPI: Rebecca Strickland is a 32 y.o. female presenting on 11/02/2022 for comprehensive medical examination.   Current medical concerns include: Sternal Chest Pain Chest pain experienced under the left breast and occasionally in the center. This pain has been occurring randomly over the past week and is described as similar to gas pains but located in the chest. The onset of these symptoms coincides with the patient having been ill for about a week after exposure to an illness from their children. The patient denies any associated symptoms such as dizziness, nausea, pain radiating to the back, or sweating during these episodes of chest pain. However, the patient does report intermittent nausea.  In addition to the chest pain, the patient has been experiencing reflux symptoms, which have been worsening over the past few weeks. These symptoms include waking up with mucus in the throat and a sensation of gas and tightness in the chest, with a desire to rub it out. The patient has been suffering from heartburn and acid reflux for two consecutive days, and over-the-counter medications have not been effective in providing relief.  Weight Management The patient also discusses challenges with weight loss and maintaining a healthy diet due to a busy lifestyle. Despite these challenges, the patient's blood pressure is reported to be within normal limits. The patient has a medical history that includes high cholesterol, previous vitamin D supplementation, high liver enzymes, and elevated fasting glucose levels. Additionally, the patient reports occasional dizziness and a  history of abnormal menstrual periods.  There is a significant family history of various health conditions, including heart disease, diabetes, liver disease, ovarian cancer, sarcoma or lymphoma, high cholesterol, and high blood pressure. It is also noted that the patient's mother has a history of breast cancer.  IMMUNIZATIONS:   Flu: Flu vaccine declined, patient does not wish to complete Prevnar 13: Prevnar 13 N/A for this patient Prevnar 20: Prevnar 20 N/A for this patient Pneumovax 23: Pneumovax 23 N/A for this patient Vac Shingrix: Shingrix N/A for this patient HPV: HPV N/A for this patient Tetanus: Tetanus completed in the last 10 years  HEALTH MAINTENANCE: Pap Smear HM Status: is up to date - records needed from GYN Mammogram HM Status: is not applicable for this patient Colon Cancer Screening HM Status: is not applicable for this patient Bone Density HM Status: is not applicable for this patient STI Testing HM Status: is not applicable for this patient  She reports regular vision exams q1-5y: No  She reports regular dental exams q 29m  Yes  The patient eats a regular, healthy diet. She endorses exercise and/or activity of:  very active   She currently: Marital Status: married Living situation: with family Sexual: monogamous  Pertinent items are noted in HPI.  Most Recent Depression Screen:     11/02/2022   10:04 AM 02/04/2022   11:06 AM  Depression screen PHQ 2/9  Decreased Interest 0 0  Down, Depressed, Hopeless 0 0  PHQ - 2 Score 0 0   Most Recent Anxiety Screen:      No data to  display         Most Recent Fall Screen:    11/02/2022   10:04 AM 05/09/2022    9:51 AM 02/04/2022   11:06 AM  Fall Risk   Falls in the past year? 0 0 0  Number falls in past yr: 0 0 0  Injury with Fall? 0 0 0  Risk for fall due to : No Fall Risks No Fall Risks No Fall Risks  Follow up Falls evaluation completed Falls evaluation completed;Education provided Falls evaluation  completed;Education provided    Past medical history, surgical history, medications, allergies, family history and social history reviewed with patient today and changes made to appropriate areas of the chart.  Past Medical History:  Past Medical History:  Diagnosis Date   Family history of breast cancer 11/20/2020   Family history of ovarian cancer 11/20/2020   Migraine    Medications:  Current Outpatient Medications on File Prior to Visit  Medication Sig   Vitamin D, Ergocalciferol, (DRISDOL) 1.25 MG (50000 UNIT) CAPS capsule Take 1 capsule (50,000 Units total) by mouth every 7 (seven) days. Take for 12 total doses(weeks) than can transition to 1000 units OTC supplement daily (Patient not taking: Reported on 11/02/2022)   No current facility-administered medications on file prior to visit.   Surgical History:  Past Surgical History:  Procedure Laterality Date   NO PAST SURGERIES     Allergies:  Allergies  Allergen Reactions   Bee Venom Hives   Shellfish Allergy Hives and Nausea And Vomiting   Latex Rash   Family History:  Family History  Problem Relation Age of Onset   Breast cancer Mother 3       reported BRCA2 variant   Hyperlipidemia Father    Hypertension Maternal Grandmother    Hyperlipidemia Maternal Grandmother    Heart failure Maternal Grandmother    Diabetes Maternal Grandmother    Liver disease Maternal Grandmother    Ovarian cancer Paternal Grandmother 37   Cancer Paternal Grandfather 78       sarcoma or lymphoma?   Breast cancer Other        MGM's sister; two primaries after age 61   Liver cancer Other        dx unknown age   Other Other        PGM's sister; brain tumor       Objective:    BP 126/80   Pulse 82   Ht 5' 4.75" (1.645 m)   Wt 260 lb 9.6 oz (118.2 kg)   LMP 10/31/2022   BMI 43.70 kg/m   Wt Readings from Last 3 Encounters:  11/02/22 260 lb 9.6 oz (118.2 kg)  02/04/22 260 lb (117.9 kg)  05/16/17 217 lb 1.6 oz (98.5 kg)    Physical  Exam Vitals and nursing note reviewed.  Constitutional:      Appearance: Normal appearance. She is obese.  HENT:     Head: Normocephalic.     Right Ear: Tympanic membrane normal.     Left Ear: Tympanic membrane normal.     Nose: Nose normal.     Mouth/Throat:     Mouth: Mucous membranes are moist.     Pharynx: Oropharynx is clear.  Eyes:     Pupils: Pupils are equal, round, and reactive to light.  Neck:     Vascular: No carotid bruit.  Cardiovascular:     Rate and Rhythm: Normal rate and regular rhythm.     Pulses: Normal pulses.  Heart sounds: Normal heart sounds.  Pulmonary:     Effort: Pulmonary effort is normal.     Breath sounds: Normal breath sounds.  Abdominal:     General: Bowel sounds are normal. There is no distension.     Palpations: Abdomen is soft.     Tenderness: There is abdominal tenderness in the epigastric area. There is no guarding.  Musculoskeletal:        General: Normal range of motion.     Cervical back: Normal range of motion. No tenderness.     Right lower leg: No edema.     Left lower leg: No edema.  Lymphadenopathy:     Cervical: No cervical adenopathy.  Skin:    General: Skin is dry.     Capillary Refill: Capillary refill takes less than 2 seconds.  Neurological:     General: No focal deficit present.     Mental Status: She is alert and oriented to person, place, and time.     Motor: No weakness.     Gait: Gait normal.  Psychiatric:        Mood and Affect: Mood normal.        Behavior: Behavior normal.        Thought Content: Thought content normal.        Judgment: Judgment normal.     Results for orders placed or performed in visit on 05/09/22  Hemoglobin A1c  Result Value Ref Range   Hgb A1c MFr Bld 5.5 4.8 - 5.6 %   Est. average glucose Bld gHb Est-mCnc 111 mg/dL  Lipid panel  Result Value Ref Range   Cholesterol, Total 198 100 - 199 mg/dL   Triglycerides 258 (H) 0 - 149 mg/dL   HDL 39 (L) >39 mg/dL   VLDL Cholesterol Cal  45 (H) 5 - 40 mg/dL   LDL Chol Calc (NIH) 114 (H) 0 - 99 mg/dL   Chol/HDL Ratio 5.1 (H) 0.0 - 4.4 ratio  CBC With Diff/Platelet  Result Value Ref Range   WBC 7.9 3.4 - 10.8 x10E3/uL   RBC 4.64 3.77 - 5.28 x10E6/uL   Hemoglobin 14.2 11.1 - 15.9 g/dL   Hematocrit 43.2 34.0 - 46.6 %   MCV 93 79 - 97 fL   MCH 30.6 26.6 - 33.0 pg   MCHC 32.9 31.5 - 35.7 g/dL   RDW 13.0 11.7 - 15.4 %   Platelets 260 150 - 450 x10E3/uL   Neutrophils 71 Not Estab. %   Lymphs 20 Not Estab. %   Monocytes 7 Not Estab. %   Eos 2 Not Estab. %   Basos 0 Not Estab. %   Neutrophils Absolute 5.6 1.4 - 7.0 x10E3/uL   Lymphocytes Absolute 1.6 0.7 - 3.1 x10E3/uL   Monocytes Absolute 0.6 0.1 - 0.9 x10E3/uL   EOS (ABSOLUTE) 0.1 0.0 - 0.4 x10E3/uL   Basophils Absolute 0.0 0.0 - 0.2 x10E3/uL   Immature Granulocytes 0 Not Estab. %   Immature Grans (Abs) 0.0 0.0 - 0.1 x10E3/uL  Comprehensive metabolic panel  Result Value Ref Range   Glucose 109 (H) 70 - 99 mg/dL   BUN 9 6 - 20 mg/dL   Creatinine, Ser 0.83 0.57 - 1.00 mg/dL   eGFR 97 >59 mL/min/1.73   BUN/Creatinine Ratio 11 9 - 23   Sodium 136 134 - 144 mmol/L   Potassium 4.2 3.5 - 5.2 mmol/L   Chloride 99 96 - 106 mmol/L   CO2 22 20 - 29 mmol/L  Calcium 9.4 8.7 - 10.2 mg/dL   Total Protein 6.8 6.0 - 8.5 g/dL   Albumin 4.5 4.0 - 5.0 g/dL   Globulin, Total 2.3 1.5 - 4.5 g/dL   Albumin/Globulin Ratio 2.0 1.2 - 2.2   Bilirubin Total 0.4 0.0 - 1.2 mg/dL   Alkaline Phosphatase 85 44 - 121 IU/L   AST 32 0 - 40 IU/L   ALT 65 (H) 0 - 32 IU/L  VITAMIN D 25 Hydroxy (Vit-D Deficiency, Fractures)  Result Value Ref Range   Vit D, 25-Hydroxy 22.9 (L) 30.0 - 100.0 ng/mL  Thyroid Panel With TSH  Result Value Ref Range   TSH 1.100 0.450 - 4.500 uIU/mL   T4, Total 6.6 4.5 - 12.0 ug/dL   T3 Uptake Ratio 23 (L) 24 - 39 %   Free Thyroxine Index 1.5 1.2 - 4.9  17-Hydroxyprogesterone  Result Value Ref Range   17-OH Progesterone LCMS 139 ng/dL  Prolactin  Result Value Ref  Range   Prolactin 15.4 4.8 - 23.3 ng/mL  Testosterone, Free, Total, SHBG  Result Value Ref Range   Testosterone 14 13 - 71 ng/dL   Testosterone, Free 0.4 0.0 - 4.2 pg/mL   Sex Hormone Binding 23.3 (L) 24.6 - 122.0 nmol/L         Assessment & Plan:   Problem List Items Addressed This Visit     Depression, major, single episode, complete remission ([place])    Patient is currently being treated with Lexapro for anxiety and depression. Her symptoms have resolved on this medication. No alarm symptoms present.  Plan: - Approve a refill of the Lexapro prescription for 1 year. - Monitor the patient's mood and symptoms, adjusting treatment if required.      Relevant Medications   escitalopram (LEXAPRO) 20 MG tablet   BMI 45.0-49.9, adult ([place]) - Primary    Patient faces challenges with weight loss and adhering to a healthy diet due to a busy lifestyle and lack of effectiveness with intentional and persistent changes. Weight loss medications are not covered by insurance. She has tried phentermine in the past, but this caused significant symptoms and was stopped. She did have success on Wegovy, but this is not an option. She has a very strong family history of CV disease and her risks are elevated with hyperlipidemia and her elevated BMI. She would greatly benefit from weight management to help reduce these risks.  Plan: - Reassess weight management strategies based on new lab results and potential changes in insurance coverage. - Encourage sustainable lifestyle changes, focusing on diet and exercise, for effective long-term weight management.      Relevant Orders   Hemoglobin A1c   Lipid panel   Comprehensive metabolic panel   CBC with Differential/Platelet   Hepatitis C antibody   TSH Rfx on Abnormal to Free T4   Family history of cardiovascular disease   Relevant Orders   Hemoglobin A1c   Lipid panel   Comprehensive metabolic panel   Mixed hyperlipidemia    Patient has a documented  history of high cholesterol and previous unsuccessful treatment with niacin. She has a strong family history of CV disease, which increases her risks of CV complications.  Plan: - Order an updated lipid panel to assess current cholesterol levels. - Explore alternative cholesterol-lowering medications, considering lab results and insurance coverage. - Reinforce the importance of dietary modifications and regular exercise. - Given strong family history, we can try to get approval for [facility] for CV health.  Relevant Orders   Lipid panel   ALT (SGPT) level raised    No symptoms at this time.  Plan: - Repeat labs today for monitoring. - Encouraged weight management to help reduce the risk of fatty liver.        Relevant Orders   Hemoglobin A1c   VITAMIN D 25 Hydroxy (Vit-D Deficiency, Fractures)   Lipid panel   Comprehensive metabolic panel   CBC with Differential/Platelet   Hepatitis C antibody   TSH Rfx on Abnormal to Free T4   Gastroesophageal reflux disease    Patient reports experiencing chest pain under the left breast and in the center, sometimes resembling gas pains. Additionally, the patient wakes up with mucus in the throat and experiences tightness in the chest. On examination today, patient has epigastric tenderness with palpation. Over-the-counter medications have not provided relief.No alarm symptoms are present.  Plan: - Initiate a trial of a proton pump inhibitor (PPI) for the management of reflux symptoms. - Advise lifestyle modifications, including changes in diet and exercise, to help alleviate symptoms. - Schedule a reevaluation of symptoms in 4-6 weeks, or sooner if symptoms exacerbate.      Relevant Medications   pantoprazole (PROTONIX) 40 MG tablet   scopolamine (TRANSDERM-SCOP) 1 MG/3DAYS   Other Relevant Orders   Hemoglobin A1c   VITAMIN D 25 Hydroxy (Vit-D Deficiency, Fractures)   Lipid panel   Comprehensive metabolic panel   CBC with  Differential/Platelet   Hepatitis C antibody   TSH Rfx on Abnormal to Free T4   Encounter for annual physical exam    CPE today with no abnormalities noted on exam.  Labs pending. Will make changes as necessary based on results.  Review of HM activities and recommendations discussed and provided on AVS Anticipatory guidance, diet, and exercise recommendations provided.  Medications, allergies, and hx reviewed and updated as necessary.  Plan to f/u with CPE in 1 year or sooner for acute/chronic health needs as directed.        Vitamin D deficiency    Patient has a known history of low vitamin D levels and was previously on prescribed vitamin D supplements. She is not currently on these at this time.  Plan: - Conduct an updated assessment of vitamin D levels. - Adjust vitamin D supplementation dosage as necessary, based on the latest lab results.      Relevant Orders   VITAMIN D 25 Hydroxy (Vit-D Deficiency, Fractures)   Motion sickness    Patient experiences occasional dizziness with certain movements on occasion. She also reports significant motion sickness when riding in the car. Nystagmus to the right noted during the examination. No alarm symptoms. Suspect BPV present.  Plan: - Suggest the use of over-the-counter meclizine for symptomatic relief of dizziness. - Prescribe a scopolamine patch to prevent motion sickness during travel. - Arrange for a follow-up in 4-6 weeks, or earlier if the patient's symptoms intensify.      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  NEXT PREVENTATIVE PHYSICAL DUE IN 1 YEAR.  PATIENT COUNSELING PROVIDED FOR ALL ADULT PATIENTS:  Consume a well balanced diet low in saturated fats, cholesterol, and moderation in carbohydrates.   This can be as simple as monitoring portion sizes and cutting back on sugary beverages such as soda and juice to start with.    Daily water consumption of at least 64 ounces.  Physical activity at least 180 minutes per week, if just starting out.   This can be as simple as taking the stairs instead of the elevator and walking 2-3 laps around the office  purposefully every day.   STD protection, partner selection, and regular testing if high risk.  Limited consumption of alcoholic beverages if alcohol is consumed.  For women, I recommend no more than 7 alcoholic beverages per week, spread out throughout the week.  Avoid "binge" drinking or consuming large quantities of alcohol in one setting.   Please let me know if you feel you may need help with reduction or quitting alcohol consumption.   Avoidance of nicotine, if used.  Please let me know if you feel you may need help with reduction or quitting nicotine use.   Daily mental health attention.  This can be in the form of 5 minute daily meditation, prayer, journaling, yoga, reflection, etc.   Purposeful attention to your emotions and mental state can significantly improve your overall wellbeing and Health.  Please know that I am here to help you with all of your health care goals and am happy to work with you to find a solution that works best for you.  The greatest advice I have received with any changes in life are to take it one step at a time, that even means if all you can focus on is the next 60 seconds, then do that and  celebrate your victories.  With any changes in life, you will have set backs, and that is OK. The important thing to remember is, if you have a set back, it is not a failure, it is an opportunity to try again!  Health Maintenance Recommendations Screening Testing Mammogram Every 1 -2 years based on history and risk factors Starting at age 69 Pap Smear Ages 21-39 every 3 years Ages 57-65 every 5 years with HPV testing More frequent testing may be required based on results and history Colon Cancer Screening Every 1-10 years based on test performed, risk factors, and history Starting at age 12 Bone Density Screening Every 2-10 years based on history Starting at age 18 for women Recommendations for men differ based on medication usage, history, and risk factors AAA Screening One time ultrasound Men 69-2 years old who have every smoked Lung Cancer Screening Low Dose Lung CT every 12 months Age 72-80 years with a 30 pack-year smoking history who still smoke or who have quit within the last 15 years  Screening Labs Routine  Labs: Complete Blood Count (CBC), Complete Metabolic Panel (CMP), Cholesterol (Lipid Panel) Every 6-12 months based on history and medications May be recommended more frequently based on current conditions or previous results Hemoglobin A1c Lab Every 3-12 months based on history and previous results Starting at age 61 or earlier with diagnosis of diabetes, high  cholesterol, BMI >26, and/or risk factors Frequent monitoring for patients with diabetes to ensure blood sugar control Thyroid Panel (TSH w/ T3 & T4) Every 6 months based on history, symptoms, and risk factors May be repeated more often if on medication HIV One time testing for all patients 52 and older May be repeated more frequently for patients with increased risk factors or exposure Hepatitis C One time testing for all patients 50 and older May be repeated more frequently for patients with increased  risk factors or exposure Gonorrhea, Chlamydia Every 12 months for all sexually active persons 13-24 years Additional monitoring may be recommended for those who are considered high risk or who have symptoms PSA Men 66-11 years old with risk factors Additional screening may be recommended from age 540-69 based on risk factors, symptoms, and history  Vaccine Recommendations Tetanus Booster All adults every 10 years Flu Vaccine All patients 6 months and older every year COVID Vaccine All patients 12 years and older Initial dosing with booster May recommend additional booster based on age and health history HPV Vaccine 2 doses all patients age 54-26 Dosing may be considered for patients over 26 Shingles Vaccine (Shingrix) 2 doses all adults 40 years and older Pneumonia (Pneumovax 23) All adults 87 years and older May recommend earlier dosing based on health history Pneumonia (Prevnar 93) All adults 53 years and older Dosed 1 year after Pneumovax 23  Additional Screening, Testing, and Vaccinations may be recommended on an individualized basis based on family history, health history, risk factors, and/or exposure.

## 2022-11-02 NOTE — Assessment & Plan Note (Signed)
Patient is currently being treated with Lexapro for anxiety and depression. Her symptoms have resolved on this medication. No alarm symptoms present.  Plan: - Approve a refill of the Lexapro prescription for 1 year. - Monitor the patient's mood and symptoms, adjusting treatment if required.

## 2022-11-02 NOTE — Assessment & Plan Note (Signed)
Patient experiences occasional dizziness with certain movements on occasion. She also reports significant motion sickness when riding in the car. Nystagmus to the right noted during the examination. No alarm symptoms. Suspect BPV present.  Plan: - Suggest the use of over-the-counter meclizine for symptomatic relief of dizziness. - Prescribe a scopolamine patch to prevent motion sickness during travel. - Arrange for a follow-up in 4-6 weeks, or earlier if the patient's symptoms intensify.

## 2022-11-02 NOTE — Patient Instructions (Addendum)
  I will see what your labs show and see if we can get approval for the [facility].  I may send in a medication for your cholesterol if insurance requires it.   I have sent in pantoprazole for the reflux. Let me know if this doesn't seem to help   I have sent in scopolamine patches for motion sickness.

## 2022-11-02 NOTE — Assessment & Plan Note (Signed)
Patient reports experiencing chest pain under the left breast and in the center, sometimes resembling gas pains. Additionally, the patient wakes up with mucus in the throat and experiences tightness in the chest. On examination today, patient has epigastric tenderness with palpation. Over-the-counter medications have not provided relief.No alarm symptoms are present.  Plan: - Initiate a trial of a proton pump inhibitor (PPI) for the management of reflux symptoms. - Advise lifestyle modifications, including changes in diet and exercise, to help alleviate symptoms. - Schedule a reevaluation of symptoms in 4-6 weeks, or sooner if symptoms exacerbate.

## 2022-11-02 NOTE — Assessment & Plan Note (Signed)
Patient has a known history of low vitamin D levels and was previously on prescribed vitamin D supplements. She is not currently on these at this time.  Plan: - Conduct an updated assessment of vitamin D levels. - Adjust vitamin D supplementation dosage as necessary, based on the latest lab results.

## 2022-11-02 NOTE — Assessment & Plan Note (Signed)
Patient has a documented history of high cholesterol and previous unsuccessful treatment with niacin. She has a strong family history of CV disease, which increases her risks of CV complications.  Plan: - Order an updated lipid panel to assess current cholesterol levels. - Explore alternative cholesterol-lowering medications, considering lab results and insurance coverage. - Reinforce the importance of dietary modifications and regular exercise. - Given strong family history, we can try to get approval for [facility] for CV health.

## 2022-11-02 NOTE — Assessment & Plan Note (Signed)

## 2022-11-02 NOTE — Assessment & Plan Note (Signed)
Patient faces challenges with weight loss and adhering to a healthy diet due to a busy lifestyle and lack of effectiveness with intentional and persistent changes. Weight loss medications are not covered by insurance. She has tried phentermine in the past, but this caused significant symptoms and was stopped. She did have success on Wegovy, but this is not an option. She has a very strong family history of CV disease and her risks are elevated with hyperlipidemia and her elevated BMI. She would greatly benefit from weight management to help reduce these risks.  Plan: - Reassess weight management strategies based on new lab results and potential changes in insurance coverage. - Encourage sustainable lifestyle changes, focusing on diet and exercise, for effective long-term weight management.

## 2022-11-03 LAB — COMPREHENSIVE METABOLIC PANEL
ALT: 37 IU/L — ABNORMAL HIGH (ref 0–32)
AST: 21 IU/L (ref 0–40)
Albumin/Globulin Ratio: 1.8 (ref 1.2–2.2)
Albumin: 4.5 g/dL (ref 3.9–4.9)
Alkaline Phosphatase: 78 IU/L (ref 44–121)
BUN/Creatinine Ratio: 11 (ref 9–23)
BUN: 8 mg/dL (ref 6–20)
Bilirubin Total: 0.4 mg/dL (ref 0.0–1.2)
CO2: 21 mmol/L (ref 20–29)
Calcium: 8.8 mg/dL (ref 8.7–10.2)
Chloride: 101 mmol/L (ref 96–106)
Creatinine, Ser: 0.73 mg/dL (ref 0.57–1.00)
Globulin, Total: 2.5 g/dL (ref 1.5–4.5)
Glucose: 83 mg/dL (ref 70–99)
Potassium: 4.6 mmol/L (ref 3.5–5.2)
Sodium: 138 mmol/L (ref 134–144)
Total Protein: 7 g/dL (ref 6.0–8.5)
eGFR: 113 mL/min/{1.73_m2} (ref >59)

## 2022-11-03 LAB — CBC WITH DIFFERENTIAL/PLATELET
Basophils Absolute: 0 10*3/uL (ref 0.0–0.2)
Basos: 1 %
EOS (ABSOLUTE): 0.2 10*3/uL (ref 0.0–0.4)
Eos: 2 %
Hematocrit: 41.4 % (ref 34.0–46.6)
Hemoglobin: 14.2 g/dL (ref 11.1–15.9)
Immature Grans (Abs): 0 10*3/uL (ref 0.0–0.1)
Immature Granulocytes: 0 %
Lymphocytes Absolute: 1.5 10*3/uL (ref 0.7–3.1)
Lymphs: 23 %
MCH: 31.1 pg (ref 26.6–33.0)
MCHC: 34.3 g/dL (ref 31.5–35.7)
MCV: 91 fL (ref 79–97)
Monocytes Absolute: 0.5 10*3/uL (ref 0.1–0.9)
Monocytes: 8 %
Neutrophils Absolute: 4.2 10*3/uL (ref 1.4–7.0)
Neutrophils: 66 %
Platelets: 260 10*3/uL (ref 150–450)
RBC: 4.56 x10E6/uL (ref 3.77–5.28)
RDW: 12.6 % (ref 11.7–15.4)
WBC: 6.4 10*3/uL (ref 3.4–10.8)

## 2022-11-03 LAB — LIPID PANEL
Chol/HDL Ratio: 4.5 ratio — ABNORMAL HIGH (ref 0.0–4.4)
Cholesterol, Total: 192 mg/dL (ref 100–199)
HDL: 43 mg/dL (ref >39)
LDL Chol Calc (NIH): 122 mg/dL — ABNORMAL HIGH (ref 0–99)
Triglycerides: 152 mg/dL — ABNORMAL HIGH (ref 0–149)
VLDL Cholesterol Cal: 27 mg/dL (ref 5–40)

## 2022-11-03 LAB — HEPATITIS C ANTIBODY: Hep C Virus Ab: NONREACTIVE

## 2022-11-03 LAB — VITAMIN D 25 HYDROXY (VIT D DEFICIENCY, FRACTURES): Vit D, 25-Hydroxy: 16 ng/mL — ABNORMAL LOW (ref 30.0–100.0)

## 2022-11-03 LAB — HEMOGLOBIN A1C
Est. average glucose Bld gHb Est-mCnc: 111 mg/dL
Hgb A1c MFr Bld: 5.5 % (ref 4.8–5.6)

## 2022-11-03 LAB — TSH RFX ON ABNORMAL TO FREE T4: TSH: 1.3 u[IU]/mL (ref 0.450–4.500)

## 2022-11-08 MED ORDER — VITAMIN D (ERGOCALCIFEROL) 1.25 MG (50000 UNIT) PO CAPS: 50000.0000 [IU] | ORAL_CAPSULE | ORAL | 1 refills | Status: DC

## 2022-11-08 NOTE — Addendum Note (Signed)
Addended by: Xianna Siverling, Clarise Cruz E on: 11/08/2022 07:55 AM   Modules accepted: Orders

## 2022-11-09 ENCOUNTER — Encounter: Payer: Self-pay | Admitting: Nurse Practitioner

## 2022-11-14 ENCOUNTER — Encounter: Payer: Self-pay | Admitting: Nurse Practitioner

## 2022-11-18 ENCOUNTER — Telehealth: Payer: Self-pay

## 2022-11-18 NOTE — Telephone Encounter (Signed)
Pt. Called stating she has a fried that is a pharm tech she told her the Qsymia would be about 300 dollars. She said Qsymia had two different medications and if you send both of those in separately for the generic it would be cheaper but was sure if we could do that or if that was ok? Phentermine/topiramate.

## 2022-11-30 NOTE — Telephone Encounter (Signed)
We can certainly try to send in the two different medications. They do not have the exact same doses available individually so we would need to get creative. Let me check to see if this is something that we could do.

## 2023-01-10 ENCOUNTER — Other Ambulatory Visit: Payer: Self-pay | Admitting: Nurse Practitioner

## 2023-01-10 DIAGNOSIS — E559 Vitamin D deficiency, unspecified: Secondary | ICD-10-CM

## 2023-03-29 ENCOUNTER — Telehealth (INDEPENDENT_AMBULATORY_CARE_PROVIDER_SITE_OTHER): Payer: BC Managed Care – PPO | Admitting: Medical

## 2023-03-29 VITALS — Wt 260.0 lb

## 2023-03-29 DIAGNOSIS — R058 Other specified cough: Secondary | ICD-10-CM

## 2023-03-29 DIAGNOSIS — J988 Other specified respiratory disorders: Secondary | ICD-10-CM

## 2023-03-29 DIAGNOSIS — H1031 Unspecified acute conjunctivitis, right eye: Secondary | ICD-10-CM

## 2023-03-29 MED ORDER — AZITHROMYCIN 250 MG PO TABS: ORAL_TABLET | ORAL | 0 refills | Status: DC

## 2023-03-29 MED ORDER — POLYMYXIN B-TRIMETHOPRIM 10000-0.1 UNIT/ML-% OP SOLN: 2.0000 [drp] | Freq: Four times a day (QID) | OPHTHALMIC | 0 refills | Status: DC

## 2023-03-29 MED ORDER — HYDROCOD POLI-CHLORPHE POLI ER 10-8 MG/5ML PO SUER: 5.0000 mL | Freq: Two times a day (BID) | ORAL | 0 refills | Status: DC

## 2023-03-29 NOTE — Progress Notes (Signed)
Subjective:     Patient ID: Rebecca Strickland, female   DOB: [birth year], 32 y.o.   MRN: [id number]  This visit type was conducted due to national recommendations for restrictions regarding the COVID-19 Pandemic (e.g. social distancing) in an effort to limit this patient's exposure and mitigate transmission in our community.  Due to their co-morbid illnesses, this patient is at least at moderate risk for complications without adequate follow up.  This format is felt to be most appropriate for this patient at this time.    Documentation for virtual audio and video telecommunications through [facility] encounter:  The patient was located at home. The provider was located in the office. The patient did consent to this visit and is aware of possible charges through their insurance for this visit.  The other persons participating in this telemedicine service were none. Time spent on call was 20 minutes and in review of previous records 20 minutes total.  This virtual service is not related to other E/M service within previous 7 days.   HPI Chief Complaint  Patient presents with   URI    Sick for over a week, coughing, sore throat, SOB, phelgm- flourent green, no fever, OTC- dayquil and nightquil   Virtual consult for illness.  Started a week ago with cold symptom, sore throat.  Started using dayquil and nyquil.  Had some improvement but now about 7-8 days later worse, coughing all the time, and in addition to those symptoms.  No fever, no body ache, no chills.  No NVD. No ear pain, no sinus pressure.   No upper teeth pain.  Feels a little SOB. No wheezing. Is coughing up green mucous in last 3 days.   Starting last night has had purulent drainage from right eye and redness, itching.  Was somewhat matted shut multiple times since last  night.   No hx/o lung disease, nonsmoker.  No other aggravating or relieving factors. No other complaint.   Past Medical History:  Diagnosis Date   Family history  of breast cancer 11/20/2020   Family history of ovarian cancer 11/20/2020   Migraine    Current Outpatient Medications on File Prior to Visit  Medication Sig Dispense Refill   escitalopram (LEXAPRO) 20 MG tablet Take 1 tablet (20 mg total) by mouth daily. 90 tablet 3   pantoprazole (PROTONIX) 40 MG tablet Take 1 tablet (40 mg total) by mouth daily. 30 tablet 3   No current facility-administered medications on file prior to visit.    Review of Systems As in subjective    Objective:   Physical Exam Due to coronavirus pandemic stay at home measures, patient visit was virtual and they were not examined in person.   Wt 260 lb (117.9 kg)   BMI 43.60 kg/m   Gen: wd, wn ,nad No wheezing or labored breathing No obvious swollen eye      Assessment:     Encounter Diagnoses  Name Primary?   Cough productive of purulent sputum Yes   Respiratory tract infection    Acute bacterial conjunctivitis of right eye        Plan:     We discussed the symptoms and concerns.  Begin Z-Pak antibiotic, begin Keflex, steroid but caution with sedation.  Can still use Mucinex DM or Claritin in the daytime.  Begin eyedrops below for pinkeye.  We discussed hygiene, preventing spread to others, supportive measures for pinkeye.  We also discussed water intake and rest.  If not much improved in  the next 3 to 4 days then call or get reevaluated in person  Aneliese was seen today for uri.  Diagnoses and all orders for this visit:  Cough productive of purulent sputum  Respiratory tract infection  Acute bacterial conjunctivitis of right eye  Other orders -     azithromycin (ZITHROMAX) 250 MG tablet; 2 tablets day 1, then 1 tablet days 2-4 -     chlorpheniramine-HYDROcodone (TUSSIONEX) 10-8 MG/5ML; Take 5 mLs by mouth 2 (two) times daily. -     trimethoprim-polymyxin b (POLYTRIM) ophthalmic solution; Place 2 drops into both eyes every 6 (six) hours.  F/u prn

## 2023-11-06 ENCOUNTER — Ambulatory Visit (INDEPENDENT_AMBULATORY_CARE_PROVIDER_SITE_OTHER): Payer: BC Managed Care – PPO | Admitting: Nurse Practitioner

## 2023-11-06 ENCOUNTER — Encounter: Payer: Self-pay | Admitting: Nurse Practitioner

## 2023-11-06 VITALS — BP 122/80 | HR 90 | Ht 64.5 in | Wt 262.2 lb

## 2023-11-06 DIAGNOSIS — F339 Major depressive disorder, recurrent, unspecified: Secondary | ICD-10-CM

## 2023-11-06 DIAGNOSIS — Z6841 Body Mass Index (BMI) 40.0 and over, adult: Secondary | ICD-10-CM | POA: Diagnosis not present

## 2023-11-06 DIAGNOSIS — F411 Generalized anxiety disorder: Secondary | ICD-10-CM | POA: Diagnosis not present

## 2023-11-06 DIAGNOSIS — Z0000 Encounter for general adult medical examination without abnormal findings: Secondary | ICD-10-CM | POA: Diagnosis not present

## 2023-11-06 DIAGNOSIS — E782 Mixed hyperlipidemia: Secondary | ICD-10-CM

## 2023-11-06 DIAGNOSIS — Z803 Family history of malignant neoplasm of breast: Secondary | ICD-10-CM

## 2023-11-06 DIAGNOSIS — E559 Vitamin D deficiency, unspecified: Secondary | ICD-10-CM

## 2023-11-06 DIAGNOSIS — K219 Gastro-esophageal reflux disease without esophagitis: Secondary | ICD-10-CM | POA: Diagnosis not present

## 2023-11-06 DIAGNOSIS — L680 Hirsutism: Secondary | ICD-10-CM

## 2023-11-06 DIAGNOSIS — N921 Excessive and frequent menstruation with irregular cycle: Secondary | ICD-10-CM

## 2023-11-06 DIAGNOSIS — F325 Major depressive disorder, single episode, in full remission: Secondary | ICD-10-CM

## 2023-11-06 DIAGNOSIS — G4709 Other insomnia: Secondary | ICD-10-CM | POA: Insufficient documentation

## 2023-11-06 DIAGNOSIS — R7401 Elevation of levels of liver transaminase levels: Secondary | ICD-10-CM

## 2023-11-06 DIAGNOSIS — E8881 Metabolic syndrome: Secondary | ICD-10-CM

## 2023-11-06 DIAGNOSIS — N926 Irregular menstruation, unspecified: Secondary | ICD-10-CM

## 2023-11-06 MED ORDER — PANTOPRAZOLE SODIUM 40 MG PO TBEC: 40.0000 mg | DELAYED_RELEASE_TABLET | Freq: Every day | ORAL | 3 refills | Status: AC

## 2023-11-06 MED ORDER — TRAZODONE HCL 50 MG PO TABS: 25.0000 mg | ORAL_TABLET | Freq: Every evening | ORAL | 6 refills | Status: AC | PRN

## 2023-11-06 MED ORDER — ESCITALOPRAM OXALATE 20 MG PO TABS: 20.0000 mg | ORAL_TABLET | Freq: Every day | ORAL | 3 refills | Status: DC

## 2023-11-06 NOTE — Assessment & Plan Note (Signed)
 Irregular periods, weight gain, carb cravings, and hirsutism. Discussed the possibility of PCOS and the need for further evaluation. PCOS can contribute to weight gain and irregular periods; further testing is needed to confirm the diagnosis. - Order labs: FSH, LH, estradiol, insulin, testosterone, vitamin D - Refer to OB/GYN for further evaluation and management

## 2023-11-06 NOTE — Assessment & Plan Note (Signed)
 Increased anxiety over the past month with difficulty calming down, trouble sleeping, and constant worrying. Lexapro has been effective but may need adjustment. Discussed increasing Lexapro to 30 mg, adding Buspar for anxiety, and using trazodone for sleep. Explained that Buspar can be used as needed or scheduled twice daily, and trazodone can be adjusted based on sleep needs. Neither medication has addictive qualities. - Prescribe trazodone 25-100 mg at bedtime as needed for sleep - Consider increasing lexapro to 30mg  for trial to see if this is helpful or consider Buspar for anxiety if needed

## 2023-11-06 NOTE — Patient Instructions (Addendum)
 I have sent in the trazodone for you to try for sleep and we will see if this helps with the anxiety and sleep. Play around with the dosage and see what works best for you.    We will see what your labs show and if we need to make any changes based on this, I will let you know. Your symptoms are definitely consistent with PCOS. This may give [place] an idea of next steps that we can take to help keep things regulated for you.

## 2023-11-06 NOTE — Assessment & Plan Note (Signed)

## 2023-11-06 NOTE — Assessment & Plan Note (Signed)
 Difficulty falling asleep and staying asleep, with mind racing at night. Sleep disturbances contribute to anxiety and depressive symptoms. Discussed trazodone as an off-label option for sleep, not expected to cause excessive sedation. - Prescribe trazodone 25-100 mg at bedtime as needed for sleep - Monitor sleep patterns and effectiveness of trazodone

## 2023-11-06 NOTE — Assessment & Plan Note (Signed)
 Mother diagnosed in 24's. Recommend mammogram start at 35.

## 2023-11-06 NOTE — Progress Notes (Signed)
 Flu/covid: Last PAP:   Shawna Clamp, DNP, AGNP-c [facility] [address] [place], [place] [address] Main Office [phone]  BP 122/80   Pulse 90   Ht 5' 4.5" (1.638 m)   Wt 262 lb 3.2 oz (118.9 kg)   BMI 44.31 kg/m    Subjective:    Patient ID: Rebecca Strickland, female    DOB: 05-22-1991, 34 y.o.   MRN: [id number]  HPI: Rebecca Strickland is a 33 y.o. female presenting on 11/06/2023 for comprehensive medical examination.   She has had a pap with Dr. Cherly Hensen, GYN.   History of Present Illness Rebecca Strickland is a 33 year old female with anxiety and depression who presents with worsening anxiety symptoms and sleep disturbances.  Over the past month, she has experienced increased anxiety, with symptoms occurring more than half the days. She has difficulty calming down during stressful situations and an inability to control her worry, significantly impacting her daily life. Her anxiety often triggers depressive symptoms, although she does not wake up feeling depressed. She has been on Lexapro for several years, which she feels has been effective, but she has noticed a decrease in its efficacy recently, particularly when she misses a dose.  She reports significant sleep disturbances, including difficulty falling asleep, restless leg movements, and racing thoughts at night. These issues have led to poor sleep quality, leaving her feeling tired and frustrated during the day. Her sleep problems have worsened over the past month, occurring nightly, and contributing to her anxiety and mood issues.  She has a history of irregular menstrual cycles, occurring every 70 to 90 days, with recent cycles being particularly prolonged and painful. She suspects she may have PCOS, noting symptoms such as increased facial hair and severe cramps outside of her menstrual period.  She has been attempting to lose weight through dieting but has found it challenging, which she finds  frustrating. She mentions that her weight may be contributing to her mood issues. She has a history of trying Zoloft and Wellbutrin, with the latter providing some energy but not alleviating anxiety.  There is a family history of breast cancer, with her mother diagnosed at 38 and having the BRCA2 mutation, although she tested negative for it.   Pertinent items are noted in HPI.   Most Recent Depression Screen:     11/06/2023    9:02 AM 11/02/2022   10:04 AM 02/04/2022   11:06 AM  Depression screen PHQ 2/9  Decreased Interest 2 0 0  Down, Depressed, Hopeless 2 0 0  PHQ - 2 Score 4 0 0  Altered sleeping 3    Tired, decreased energy 3    Change in appetite 3    Feeling bad or failure about yourself  2    Trouble concentrating 0    Moving slowly or fidgety/restless 0    Suicidal thoughts 0    PHQ-9 Score 15    Difficult doing work/chores Somewhat difficult     Most Recent Anxiety Screen:     11/06/2023    9:19 AM  GAD 7 : Generalized Anxiety Score  Nervous, Anxious, on Edge 2  Control/stop worrying 3  Worry too much - different things 3  Trouble relaxing 2  Restless 0  Easily annoyed or irritable 3  Afraid - awful might happen 1  Total GAD 7 Score 14  Anxiety Difficulty Very difficult   Most Recent Fall Screen:    11/06/2023    9:02 AM 11/02/2022  10:04 AM 05/09/2022    9:51 AM 02/04/2022   11:06 AM  Fall Risk   Falls in the past year? 0 0 0 0  Number falls in past yr: 0 0 0 0  Injury with Fall? 0 0 0 0  Risk for fall due to : No Fall Risks No Fall Risks No Fall Risks No Fall Risks  Follow up Falls evaluation completed Falls evaluation completed Falls evaluation completed;Education provided Falls evaluation completed;Education provided    Past medical history, surgical history, medications, allergies, family history and social history reviewed with patient today and changes made to appropriate areas of the chart.  Past Medical History:  Past Medical History:   Diagnosis Date   Family history of breast cancer 11/20/2020   Family history of ovarian cancer 11/20/2020   Migraine    Medications:  No current outpatient medications on file prior to visit.   No current facility-administered medications on file prior to visit.   Surgical History:  Past Surgical History:  Procedure Laterality Date   NO PAST SURGERIES     Allergies:  Allergies  Allergen Reactions   Bee Venom Hives   Shellfish Allergy Hives and Nausea And Vomiting   Latex Rash   Family History:  Family History  Problem Relation Age of Onset   Breast cancer Mother 51       reported BRCA2 variant   Hyperlipidemia Father    Hypertension Maternal Grandmother    Hyperlipidemia Maternal Grandmother    Heart failure Maternal Grandmother    Diabetes Maternal Grandmother    Liver disease Maternal Grandmother    Ovarian cancer Paternal Grandmother 23   Cancer Paternal Grandfather 70       sarcoma or lymphoma?   Breast cancer Other        MGM's sister; two primaries after age 69   Liver cancer Other        dx unknown age   Other Other        PGM's sister; brain tumor       Objective:    BP 122/80   Pulse 90   Ht 5' 4.5" (1.638 m)   Wt 262 lb 3.2 oz (118.9 kg)   BMI 44.31 kg/m   Wt Readings from Last 3 Encounters:  11/06/23 262 lb 3.2 oz (118.9 kg)  03/29/23 260 lb (117.9 kg)  11/02/22 260 lb 9.6 oz (118.2 kg)    Physical Exam Vitals and nursing note reviewed.  Constitutional:      General: She is not in acute distress.    Appearance: Normal appearance. She is obese. She is not ill-appearing.  HENT:     Head: Normocephalic and atraumatic.     Right Ear: Hearing, tympanic membrane, ear canal and external ear normal.     Left Ear: Hearing, tympanic membrane, ear canal and external ear normal.     Nose: Nose normal.     Right Sinus: No maxillary sinus tenderness or frontal sinus tenderness.     Left Sinus: No maxillary sinus tenderness or frontal sinus tenderness.      Mouth/Throat:     Lips: Pink.     Mouth: Mucous membranes are moist.     Pharynx: Oropharynx is clear.  Eyes:     General: Lids are normal. Vision grossly intact.     Extraocular Movements: Extraocular movements intact.     Conjunctiva/sclera: Conjunctivae normal.     Pupils: Pupils are equal, round, and reactive to light.  Funduscopic exam:    Right eye: Red reflex present.        Left eye: Red reflex present.    Visual Fields: Right eye visual fields normal and left eye visual fields normal.  Neck:     Thyroid: No thyromegaly.     Vascular: No carotid bruit.  Cardiovascular:     Rate and Rhythm: Normal rate and regular rhythm.     Chest Wall: PMI is not displaced.     Pulses: Normal pulses.          Dorsalis pedis pulses are 2+ on the right side and 2+ on the left side.       Posterior tibial pulses are 2+ on the right side and 2+ on the left side.     Heart sounds: Normal heart sounds. No murmur heard. Pulmonary:     Effort: Pulmonary effort is normal. No respiratory distress.     Breath sounds: Normal breath sounds.  Abdominal:     General: Abdomen is flat. Bowel sounds are normal. There is no distension.     Palpations: Abdomen is soft. There is no hepatomegaly, splenomegaly or mass.     Tenderness: There is no abdominal tenderness. There is no right CVA tenderness, left CVA tenderness, guarding or rebound.  Musculoskeletal:        General: Normal range of motion.     Cervical back: Full passive range of motion without pain, normal range of motion and neck supple. No tenderness.     Right lower leg: No edema.     Left lower leg: No edema.  Feet:     Left foot:     Toenail Condition: Left toenails are normal.  Lymphadenopathy:     Cervical: No cervical adenopathy.     Upper Body:     Right upper body: No supraclavicular adenopathy.     Left upper body: No supraclavicular adenopathy.  Skin:    General: Skin is warm and dry.     Capillary Refill: Capillary  refill takes less than 2 seconds.     Nails: There is no clubbing.  Neurological:     General: No focal deficit present.     Mental Status: She is alert and oriented to person, place, and time.     GCS: GCS eye subscore is 4. GCS verbal subscore is 5. GCS motor subscore is 6.     Sensory: Sensation is intact.     Motor: Motor function is intact.     Coordination: Coordination is intact.     Gait: Gait is intact.     Deep Tendon Reflexes: Reflexes are normal and symmetric.  Psychiatric:        Attention and Perception: Attention normal.        Mood and Affect: Mood normal.        Speech: Speech normal.        Behavior: Behavior normal. Behavior is cooperative.        Thought Content: Thought content normal.        Cognition and Memory: Cognition and memory normal.        Judgment: Judgment normal.      Results for orders placed or performed in visit on 11/02/22  Hemoglobin A1c   Collection Time: 11/02/22 11:04 AM  Result Value Ref Range   Hgb A1c MFr Bld 5.5 4.8 - 5.6 %   Est. average glucose Bld gHb Est-mCnc 111 mg/dL  VITAMIN D 25 Hydroxy (Vit-D Deficiency, Fractures)  Collection Time: 11/02/22 11:04 AM  Result Value Ref Range   Vit D, 25-Hydroxy 16.0 (L) 30.0 - 100.0 ng/mL  Lipid panel   Collection Time: 11/02/22 11:04 AM  Result Value Ref Range   Cholesterol, Total 192 100 - 199 mg/dL   Triglycerides 960 (H) 0 - 149 mg/dL   HDL 43 >45 mg/dL   VLDL Cholesterol Cal 27 5 - 40 mg/dL   LDL Chol Calc (NIH) 409 (H) 0 - 99 mg/dL   Chol/HDL Ratio 4.5 (H) 0.0 - 4.4 ratio  Comprehensive metabolic panel   Collection Time: 11/02/22 11:04 AM  Result Value Ref Range   Glucose 83 70 - 99 mg/dL   BUN 8 6 - 20 mg/dL   Creatinine, Ser 8.11 0.57 - 1.00 mg/dL   eGFR 914 >78 GN/FAO/1.30   BUN/Creatinine Ratio 11 9 - 23   Sodium 138 134 - 144 mmol/L   Potassium 4.6 3.5 - 5.2 mmol/L   Chloride 101 96 - 106 mmol/L   CO2 21 20 - 29 mmol/L   Calcium 8.8 8.7 - 10.2 mg/dL   Total  Protein 7.0 6.0 - 8.5 g/dL   Albumin 4.5 3.9 - 4.9 g/dL   Globulin, Total 2.5 1.5 - 4.5 g/dL   Albumin/Globulin Ratio 1.8 1.2 - 2.2   Bilirubin Total 0.4 0.0 - 1.2 mg/dL   Alkaline Phosphatase 78 44 - 121 IU/L   AST 21 0 - 40 IU/L   ALT 37 (H) 0 - 32 IU/L  CBC with Differential/Platelet   Collection Time: 11/02/22 11:04 AM  Result Value Ref Range   WBC 6.4 3.4 - 10.8 x10E3/uL   RBC 4.56 3.77 - 5.28 x10E6/uL   Hemoglobin 14.2 11.1 - 15.9 g/dL   Hematocrit 86.5 78.4 - 46.6 %   MCV 91 79 - 97 fL   MCH 31.1 26.6 - 33.0 pg   MCHC 34.3 31.5 - 35.7 g/dL   RDW 69.6 29.5 - 28.4 %   Platelets 260 150 - 450 x10E3/uL   Neutrophils 66 Not Estab. %   Lymphs 23 Not Estab. %   Monocytes 8 Not Estab. %   Eos 2 Not Estab. %   Basos 1 Not Estab. %   Neutrophils Absolute 4.2 1.4 - 7.0 x10E3/uL   Lymphocytes Absolute 1.5 0.7 - 3.1 x10E3/uL   Monocytes Absolute 0.5 0.1 - 0.9 x10E3/uL   EOS (ABSOLUTE) 0.2 0.0 - 0.4 x10E3/uL   Basophils Absolute 0.0 0.0 - 0.2 x10E3/uL   Immature Granulocytes 0 Not Estab. %   Immature Grans (Abs) 0.0 0.0 - 0.1 x10E3/uL  Hepatitis C antibody   Collection Time: 11/02/22 11:04 AM  Result Value Ref Range   Hep C Virus Ab Non Reactive Non Reactive  TSH Rfx on Abnormal to Free T4   Collection Time: 11/02/22 11:04 AM  Result Value Ref Range   TSH 1.300 0.450 - 4.500 uIU/mL       Assessment & Plan:   Problem List Items Addressed This Visit     Generalized anxiety disorder   Increased anxiety over the past month with difficulty calming down, trouble sleeping, and constant worrying. Lexapro has been effective but may need adjustment. Discussed increasing Lexapro to 30 mg, adding Buspar for anxiety, and using trazodone for sleep. Explained that Buspar can be used as needed or scheduled twice daily, and trazodone can be adjusted based on sleep needs. Neither medication has addictive qualities. - Prescribe trazodone 25-100 mg at bedtime as needed for sleep - Consider  increasing lexapro to 30mg  for trial to see if this is helpful or consider Buspar for anxiety if needed      Relevant Medications   traZODone (DESYREL) 50 MG tablet   escitalopram (LEXAPRO) 20 MG tablet   Other Relevant Orders   TSH   Depression, recurrent (HCC)   Depression appears secondary to anxiety, exacerbated by poor sleep and stress. Lexapro has been effective, but recent increase in anxiety has led to a resurgence of depressive symptoms. Increasing Lexapro to 30 mg may help manage both anxiety and depressive symptoms. - Continue Lexapro, consider increasing to 30 mg daily - Monitor depressive symptoms and adjust treatment as needed       Relevant Medications   traZODone (DESYREL) 50 MG tablet   escitalopram (LEXAPRO) 20 MG tablet   Abnormal menses   Irregular periods, weight gain, carb cravings, and hirsutism. Discussed the possibility of PCOS and the need for further evaluation. PCOS can contribute to weight gain and irregular periods; further testing is needed to confirm the diagnosis. - Order labs: FSH, LH, estradiol, insulin, testosterone, vitamin D - Refer to OB/GYN for further evaluation and management      Relevant Orders   Insulin, random   Testosterone, Total, LC/MS/MS   FSH/LH   Estradiol   Gastroesophageal reflux disease   Protonix has been effective in managing symptoms, taken every other day or every two days. - Refill Protonix prescription       Relevant Medications   pantoprazole (PROTONIX) 40 MG tablet   Other insomnia   Difficulty falling asleep and staying asleep, with mind racing at night. Sleep disturbances contribute to anxiety and depressive symptoms. Discussed trazodone as an off-label option for sleep, not expected to cause excessive sedation. - Prescribe trazodone 25-100 mg at bedtime as needed for sleep - Monitor sleep patterns and effectiveness of trazodone      BMI 45.0-49.9, adult (HCC)   Relevant Orders   CBC with  Differential/Platelet   CMP14+EGFR   Hemoglobin A1c   Lipid panel   TSH   Mixed hyperlipidemia   Relevant Orders   Lipid panel   ALT (SGPT) level raised   Relevant Orders   CMP14+EGFR   Encounter for annual physical exam - Primary   Relevant Orders   CBC with Differential/Platelet   CMP14+EGFR   Hemoglobin A1c   Lipid panel   TSH   Vitamin D deficiency   Relevant Orders   VITAMIN D 25 Hydroxy (Vit-D Deficiency, Fractures)   Other Visit Diagnoses       Menorrhagia with irregular cycle       Relevant Orders   Insulin, random   Testosterone, Total, LC/MS/MS   FSH/LH   Estradiol     Hirsutism       Relevant Orders   Insulin, random   Testosterone, Total, LC/MS/MS   FSH/LH   Estradiol      General Health Maintenance Discussed Alyene Predmore mammograms due to family history of breast cancer. Mother diagnosed at age 3 with BRCA2 mutation. Starting mammograms at age 52 is recommended. - Start mammograms at age 45 - Monitor for any new symptoms or changes in health  Follow-up - Follow up in 4 weeks to assess response to medication adjustments - Review lab results and adjust treatment as needed - Schedule follow-up with OB/GYN for PCOS evaluation.   Follow up plan: Return in about 1 year (around 11/05/2024) for CPE.  NEXT PREVENTATIVE PHYSICAL DUE IN 1 YEAR.  PATIENT COUNSELING PROVIDED FOR ALL  ADULT PATIENTS: A well balanced diet low in saturated fats, cholesterol, and moderation in carbohydrates.  This can be as simple as monitoring portion sizes and cutting back on sugary beverages such as soda and juice to start with.    Daily water consumption of at least 64 ounces.  Physical activity at least 180 minutes per week.  If just starting out, start 10 minutes a day and work your way up.   This can be as simple as taking the stairs instead of the elevator and walking 2-3 laps around the office  purposefully every day.   STD protection, partner selection, and regular  testing if high risk.  Limited consumption of alcoholic beverages if alcohol is consumed. For men, I recommend no more than 14 alcoholic beverages per week, spread out throughout the week (max 2 per day). Avoid "binge" drinking or consuming large quantities of alcohol in one setting.  Please let me know if you feel you may need help with reduction or quitting alcohol consumption.   Avoidance of nicotine, if used. Please let me know if you feel you may need help with reduction or quitting nicotine use.   Daily mental health attention. This can be in the form of 5 minute daily meditation, prayer, journaling, yoga, reflection, etc.  Purposeful attention to your emotions and mental state can significantly improve your overall wellbeing  and  Health.  Please know that I am here to help you with all of your health care goals and am happy to work with you to find a solution that works best for you.  The greatest advice I have received with any changes in life are to take it one step at a time, that even means if all you can focus on is the next 60 seconds, then do that and celebrate your victories.  With any changes in life, you will have set backs, and that is OK. The important thing to remember is, if you have a set back, it is not a failure, it is an opportunity to try again! Screening Testing Mammogram Every 1 -2 years based on history and risk factors Starting at age 55 Pap Smear Ages 21-39 every 3 years Ages 65-65 every 5 years with HPV testing More frequent testing may be required based on results and history Colon Cancer Screening Every 1-10 years based on test performed, risk factors, and history Starting at age 41 Bone Density Screening Every 2-10 years based on history Starting at age 15 for women Recommendations for men differ based on medication usage, history, and risk factors AAA Screening One time ultrasound Men 63-66 years old who have every smoked Lung Cancer  Screening Low Dose Lung CT every 12 months Age 68-80 years with a 30 pack-year smoking history who still smoke or who have quit within the last 15 years   Screening Labs Routine  Labs: Complete Blood Count (CBC), Complete Metabolic Panel (CMP), Cholesterol (Lipid Panel) Every 6-12 months based on history and medications May be recommended more frequently based on current conditions or previous results Hemoglobin A1c Lab Every 3-12 months based on history and previous results Starting at age 39 or earlier with diagnosis of diabetes, high cholesterol, BMI >26, and/or risk factors Frequent monitoring for patients with diabetes to ensure blood sugar control Thyroid Panel (TSH) Every 6 months based on history, symptoms, and risk factors May be repeated more often if on medication HIV One time testing for all patients 44 and older May be repeated more frequently  for patients with increased risk factors or exposure Hepatitis C One time testing for all patients 18 and older May be repeated more frequently for patients with increased risk factors or exposure Gonorrhea, Chlamydia Every 12 months for all sexually active persons 13-24 years Additional monitoring may be recommended for those who are considered high risk or who have symptoms Every 12 months for any woman on birth control, regardless of sexual activity PSA Men 27-40 years old with risk factors Additional screening may be recommended from age 70-69 based on risk factors, symptoms, and history  Vaccine Recommendations Tetanus Booster All adults every 10 years Flu Vaccine All patients 6 months and older every year COVID Vaccine All patients 12 years and older Initial dosing with booster May recommend additional booster based on age and health history HPV Vaccine 2 doses all patients age 56-26 Dosing may be considered for patients over 26 Shingles Vaccine (Shingrix) 2 doses all adults 55 years and older Pneumonia (Pneumovax  84) All adults 65 years and older May recommend earlier dosing based on health history One year apart from Prevnar 49 Pneumonia (Prevnar 85) All adults 65 years and older Dosed 1 year after Pneumovax 23 Pneumonia (Prevnar 20) One time alternative to the two dosing of 13 and 23 For all adults with initial dose of 23, 20 is recommended 1 year later For all adults with initial dose of 13, 23 is still recommended as second option 1 year later

## 2023-11-06 NOTE — Assessment & Plan Note (Signed)
 Depression appears secondary to anxiety, exacerbated by poor sleep and stress. Lexapro has been effective, but recent increase in anxiety has led to a resurgence of depressive symptoms. Increasing Lexapro to 30 mg may help manage both anxiety and depressive symptoms. - Continue Lexapro, consider increasing to 30 mg daily - Monitor depressive symptoms and adjust treatment as needed

## 2023-11-06 NOTE — Assessment & Plan Note (Signed)
 Protonix has been effective in managing symptoms, taken every other day or every two days. - Refill Protonix prescription

## 2023-11-07 LAB — CBC WITH DIFFERENTIAL/PLATELET
Basophils Absolute: 0 10*3/uL (ref 0.0–0.2)
Basos: 0 %
EOS (ABSOLUTE): 0.1 10*3/uL (ref 0.0–0.4)
Eos: 2 %
Hematocrit: 41.9 % (ref 34.0–46.6)
Hemoglobin: 14.4 g/dL (ref 11.1–15.9)
Immature Grans (Abs): 0 10*3/uL (ref 0.0–0.1)
Immature Granulocytes: 0 %
Lymphocytes Absolute: 0.4 10*3/uL — ABNORMAL LOW (ref 0.7–3.1)
Lymphs: 7 %
MCH: 31.3 pg (ref 26.6–33.0)
MCHC: 34.4 g/dL (ref 31.5–35.7)
MCV: 91 fL (ref 79–97)
Monocytes Absolute: 0.5 10*3/uL (ref 0.1–0.9)
Monocytes: 8 %
Neutrophils Absolute: 5.1 10*3/uL (ref 1.4–7.0)
Neutrophils: 83 %
Platelets: 199 10*3/uL (ref 150–450)
RBC: 4.6 x10E6/uL (ref 3.77–5.28)
RDW: 12.5 % (ref 11.7–15.4)
WBC: 6.2 10*3/uL (ref 3.4–10.8)

## 2023-11-07 LAB — HEMOGLOBIN A1C
Est. average glucose Bld gHb Est-mCnc: 111 mg/dL
Hgb A1c MFr Bld: 5.5 % (ref 4.8–5.6)

## 2023-11-07 LAB — CMP14+EGFR
ALT: 38 [IU]/L — ABNORMAL HIGH (ref 0–32)
AST: 20 [IU]/L (ref 0–40)
Albumin: 4.6 g/dL (ref 3.9–4.9)
Alkaline Phosphatase: 74 [IU]/L (ref 44–121)
BUN/Creatinine Ratio: 15 (ref 9–23)
BUN: 11 mg/dL (ref 6–20)
Bilirubin Total: 0.5 mg/dL (ref 0.0–1.2)
CO2: 22 mmol/L (ref 20–29)
Calcium: 9.1 mg/dL (ref 8.7–10.2)
Chloride: 99 mmol/L (ref 96–106)
Creatinine, Ser: 0.74 mg/dL (ref 0.57–1.00)
Globulin, Total: 2.5 g/dL (ref 1.5–4.5)
Glucose: 90 mg/dL (ref 70–99)
Potassium: 4.1 mmol/L (ref 3.5–5.2)
Sodium: 136 mmol/L (ref 134–144)
Total Protein: 7.1 g/dL (ref 6.0–8.5)
eGFR: 110 mL/min/{1.73_m2} (ref >59)

## 2023-11-07 LAB — LIPID PANEL
Chol/HDL Ratio: 4.9 {ratio} — ABNORMAL HIGH (ref 0.0–4.4)
Cholesterol, Total: 190 mg/dL (ref 100–199)
HDL: 39 mg/dL — ABNORMAL LOW (ref >39)
LDL Chol Calc (NIH): 110 mg/dL — ABNORMAL HIGH (ref 0–99)
Triglycerides: 236 mg/dL — ABNORMAL HIGH (ref 0–149)
VLDL Cholesterol Cal: 41 mg/dL — ABNORMAL HIGH (ref 5–40)

## 2023-11-07 LAB — TSH: TSH: 1.02 u[IU]/mL (ref 0.450–4.500)

## 2023-11-10 ENCOUNTER — Encounter: Payer: Self-pay | Admitting: Nurse Practitioner

## 2023-11-10 DIAGNOSIS — E782 Mixed hyperlipidemia: Secondary | ICD-10-CM

## 2023-11-10 NOTE — Addendum Note (Signed)
 Addended by: Caryle Helgeson, Huntley Dec E on: 11/10/2023 08:09 AM   Modules accepted: Orders

## 2023-11-11 LAB — VITAMIN D 25 HYDROXY (VIT D DEFICIENCY, FRACTURES): Vit D, 25-Hydroxy: 19.5 ng/mL — ABNORMAL LOW (ref 30.0–100.0)

## 2023-11-11 LAB — FSH/LH
FSH: 1.6 m[IU]/mL
LH: 2 m[IU]/mL

## 2023-11-11 LAB — ESTRADIOL: Estradiol: 44.9 pg/mL

## 2023-11-11 LAB — INSULIN, RANDOM: INSULIN: 21.6 u[IU]/mL (ref 2.6–24.9)

## 2023-11-11 LAB — TESTOSTERONE, TOTAL, LC/MS/MS: Testosterone, total: 20.2 ng/dL (ref 10.0–55.0)

## 2023-11-13 MED ORDER — ATORVASTATIN CALCIUM 10 MG PO TABS: ORAL_TABLET | ORAL | 3 refills | Status: AC

## 2023-11-14 ENCOUNTER — Ambulatory Visit
Admission: RE | Admit: 2023-11-14 | Discharge: 2023-11-14 | Disposition: A | Discharge status: Home / Self Care | Payer: BC Managed Care – PPO | Source: Ambulatory Visit | Attending: Nurse Practitioner | Admitting: Nurse Practitioner

## 2023-11-14 DIAGNOSIS — E8881 Metabolic syndrome: Secondary | ICD-10-CM

## 2023-11-14 DIAGNOSIS — R945 Abnormal results of liver function studies: Secondary | ICD-10-CM | POA: Diagnosis not present

## 2023-11-14 DIAGNOSIS — Z6841 Body Mass Index (BMI) 40.0 and over, adult: Secondary | ICD-10-CM

## 2023-11-14 DIAGNOSIS — R7401 Elevation of levels of liver transaminase levels: Secondary | ICD-10-CM

## 2023-11-14 DIAGNOSIS — E782 Mixed hyperlipidemia: Secondary | ICD-10-CM

## 2023-11-17 LAB — SPECIMEN STATUS REPORT

## 2023-11-17 LAB — PROLACTIN: Prolactin: 8.3 ng/mL (ref 4.8–33.4)

## 2023-11-17 LAB — ANTI MULLERIAN HORMONE: ANTI-MULLERIAN HORMONE (AMH): 2.35 ng/mL

## 2023-11-29 ENCOUNTER — Other Ambulatory Visit: Payer: Self-pay | Admitting: Nurse Practitioner

## 2023-11-29 DIAGNOSIS — K760 Fatty (change of) liver, not elsewhere classified: Secondary | ICD-10-CM

## 2023-11-29 DIAGNOSIS — R7401 Elevation of levels of liver transaminase levels: Secondary | ICD-10-CM

## 2023-11-29 DIAGNOSIS — Z8249 Family history of ischemic heart disease and other diseases of the circulatory system: Secondary | ICD-10-CM

## 2023-11-29 DIAGNOSIS — E8881 Metabolic syndrome: Secondary | ICD-10-CM

## 2023-11-29 DIAGNOSIS — Z6841 Body Mass Index (BMI) 40.0 and over, adult: Secondary | ICD-10-CM

## 2023-11-29 DIAGNOSIS — E782 Mixed hyperlipidemia: Secondary | ICD-10-CM

## 2023-12-04 ENCOUNTER — Telehealth: Payer: Self-pay | Admitting: Nurse Practitioner

## 2023-12-04 NOTE — Telephone Encounter (Signed)
 Copied from CRM [id number]. Topic: Referral - Question >> Dec 04, 2023 10:01 AM Elle L wrote: Reason for CRM: The patient is requesting that her referral be sent to [facility] [address] [place], [place] [address] Phone: [phone] as [facility] Ginette Otto was unable to see her until the end of June.  Please call pt

## 2023-12-05 DIAGNOSIS — Z91040 Latex allergy status: Secondary | ICD-10-CM | POA: Diagnosis not present

## 2023-12-05 DIAGNOSIS — K3589 Other acute appendicitis without perforation or gangrene: Secondary | ICD-10-CM | POA: Diagnosis not present

## 2023-12-05 DIAGNOSIS — Z6841 Body Mass Index (BMI) 40.0 and over, adult: Secondary | ICD-10-CM | POA: Diagnosis not present

## 2023-12-05 DIAGNOSIS — Z79899 Other long term (current) drug therapy: Secondary | ICD-10-CM | POA: Diagnosis not present

## 2023-12-05 DIAGNOSIS — K760 Fatty (change of) liver, not elsewhere classified: Secondary | ICD-10-CM | POA: Diagnosis not present

## 2023-12-05 DIAGNOSIS — R161 Splenomegaly, not elsewhere classified: Secondary | ICD-10-CM | POA: Diagnosis not present

## 2023-12-05 DIAGNOSIS — K3580 Unspecified acute appendicitis: Secondary | ICD-10-CM | POA: Diagnosis not present

## 2023-12-05 DIAGNOSIS — K353 Acute appendicitis with localized peritonitis, without perforation or gangrene: Secondary | ICD-10-CM | POA: Diagnosis not present

## 2023-12-19 DIAGNOSIS — K760 Fatty (change of) liver, not elsewhere classified: Secondary | ICD-10-CM | POA: Diagnosis not present

## 2024-01-04 ENCOUNTER — Ambulatory Visit
Admission: EM | Admit: 2024-01-04 | Discharge: 2024-01-04 | Disposition: A | Discharge status: Home / Self Care | Attending: Family Medicine | Admitting: Family Medicine

## 2024-01-04 ENCOUNTER — Ambulatory Visit: Payer: Self-pay

## 2024-01-04 DIAGNOSIS — H538 Other visual disturbances: Secondary | ICD-10-CM | POA: Diagnosis not present

## 2024-01-04 NOTE — Discharge Instructions (Addendum)
 Please schedule an urgent appointment with Dr. Mason Sole.  He is the ophthalmologist on-call.  He will need a complete eye exam to further test for your blurred vision.  They are assigned as the urgent eye specialist with [facility] and should be able to see you relatively urgently.

## 2024-01-04 NOTE — Telephone Encounter (Signed)
 Chief Complaint: Blurred vision  Symptoms: Blurred vision, nausea Frequency: Constant onset Monday Pertinent Negatives: Patient denies vomiting, floaters, headache, chest pain, weakness Disposition: [] ED /[x] Urgent Care (no appt availability in office) / [] Appointment(In office/virtual)/ []  [facility]/ [] Home Care/ [] Refused Recommended Disposition /[] [facility]/ []  Follow-up with PCP Additional Notes: Patient states she wore a motion sickness patch without complications and change it out on Monday for a new one when she suddenly started to experience blurred vision. Patient states she typically has great vision but it is difficult for her to see text messages on her phone without hold it far away from her eyes. Patient also reports feeling nauseous while looking at the computer screen at work. Care advice was given and urgent care was recommended due to no appointment availability in office this week.   Copied from CRM [id number]. Topic: Clinical - Red Word Triage >> Jan 04, 2024 11:03 AM Lotus Round B wrote: [facility] that prompted transfer to Nurse Triage: blurred vision that could be from taking motion sickness medication Reason for Disposition  [1] Brief (now gone) blurred vision AND [2] unexplained  Answer Assessment - Initial Assessment Questions 1. DESCRIPTION: "How has your vision changed?" (e.g., complete vision loss, blurred vision, double vision, floaters, etc.)     Blurred vision  2. LOCATION: "One or both eyes?" If one, ask: "Which eye?"     Both eyes  3. SEVERITY: "Can you see anything?" If Yes, ask: "What can you see?" (e.g., fine print)     Moderate (can't see text messages clearly) 4. ONSET: "When did this begin?" "Did it start suddenly or has this been gradual?"     Monday morning, suddenly  5. PATTERN: "Does this come and go, or has it been constant since it started?"     Constant  6. PAIN: "Is there any pain in your eye(s)?"  (Scale 1-10; or mild,  moderate, severe)   - NONE (0): No pain.   - MILD (1-3): Doesn't interfere with normal activities.   - MODERATE (4-7): Interferes with normal activities or awakens from sleep.    - SEVERE (8-10): Excruciating pain, unable to do any normal activities.     No 7. CONTACTS-GLASSES: "Do you wear contacts or glasses?"     No  8. CAUSE: "What do you think is causing this visual problem?"     Unsure what's causing, maybe related to motion sickness patches 9. OTHER SYMPTOMS: "Do you have any other symptoms?" (e.g., confusion, headache, arm or leg weakness, speech problems)     Nausea  Protocols used: Vision Loss or Change-A-AH

## 2024-01-04 NOTE — ED Provider Notes (Signed)
 [facility]  Note:  This document was prepared using Conservation officer, historic buildings and may include unintentional dictation errors.  MRN: [id number] DOB: 08/26/1991  Subjective:   Rebecca Strickland is a 33 y.o. female presenting for 2-day history of acute onset mostly constant blurred vision.  Patient reports that she feels like her eyes are moving in slow motion.  She has felt right ear fullness and intermittent pain.  Has had intermittent dizziness.  Otherwise, has never had any particular ophthalmologic issues.  No eye pain, photophobia, eye trauma, contact lens or eyeglass use.  No sinus pain, sinus congestion and drainage.  No ear drainage, fever.  No current facility-administered medications for this encounter.  Current Outpatient Medications:    atorvastatin (LIPITOR) 10 MG tablet, Take 1 tablet by mouth three nights a week. For cholesterol., Disp: 36 tablet, Rfl: 3   escitalopram (LEXAPRO) 20 MG tablet, Take 1 tablet (20 mg total) by mouth daily., Disp: 90 tablet, Rfl: 3   pantoprazole (PROTONIX) 40 MG tablet, Take 1 tablet (40 mg total) by mouth daily., Disp: 90 tablet, Rfl: 3   traZODone (DESYREL) 50 MG tablet, Take 0.5-2 tablets (25-100 mg total) by mouth at bedtime as needed for sleep., Disp: 60 tablet, Rfl: 6   Allergies  Allergen Reactions   Bee Venom Hives   Shellfish Allergy Hives and Nausea And Vomiting   Latex Rash    Past Medical History:  Diagnosis Date   Family history of breast cancer 11/20/2020   Family history of ovarian cancer 11/20/2020   Migraine      Past Surgical History:  Procedure Laterality Date   APPENDECTOMY     NO PAST SURGERIES      Family History  Problem Relation Age of Onset   Breast cancer Mother 48       reported BRCA2 variant   Hyperlipidemia Father    Hypertension Maternal Grandmother    Hyperlipidemia Maternal Grandmother    Heart failure Maternal Grandmother    Diabetes Maternal Grandmother    Liver  disease Maternal Grandmother    Ovarian cancer Paternal Grandmother 16   Cancer Paternal Grandfather 69       sarcoma or lymphoma?   Breast cancer Other        MGM's sister; two primaries after age 1   Liver cancer Other        dx unknown age   Other Other        PGM's sister; brain tumor    Social History   Tobacco Use   Smoking status: Never   Smokeless tobacco: Never  Vaping Use   Vaping status: Never Used  Substance Use Topics   Alcohol use: Yes    Comment: occasional   Drug use: No    ROS   Objective:   Vitals: BP 115/80 (BP Location: Right Arm)   Pulse 85   Temp 97.8 F (36.6 C) (Oral)   Resp 20   LMP 11/11/2023 (Exact Date)   SpO2 96%   Breastfeeding No   Visual Acuity Right Eye Distance: 20/40 Left Eye Distance: 20/30 Bilateral Distance: 20/25  Physical Exam Constitutional:      General: She is not in acute distress.    Appearance: Normal appearance. She is well-developed and normal weight. She is not ill-appearing, toxic-appearing or diaphoretic.  HENT:     Head: Normocephalic and atraumatic.     Right Ear: Tympanic membrane, ear canal and external ear normal. No drainage or  tenderness. No middle ear effusion. There is no impacted cerumen. Tympanic membrane is not erythematous or bulging.     Left Ear: Tympanic membrane, ear canal and external ear normal. No drainage or tenderness.  No middle ear effusion. There is no impacted cerumen. Tympanic membrane is not erythematous or bulging.     Nose: Nose normal. No congestion or rhinorrhea.     Mouth/Throat:     Mouth: Mucous membranes are moist. No oral lesions.     Pharynx: No pharyngeal swelling, oropharyngeal exudate, posterior oropharyngeal erythema or uvula swelling.     Tonsils: No tonsillar exudate or tonsillar abscesses.  Eyes:     General: Lids are normal. Lids are everted, no foreign bodies appreciated. Vision grossly intact. No scleral icterus.       Right eye: No foreign body, discharge or  hordeolum.        Left eye: No foreign body, discharge or hordeolum.     Extraocular Movements: Extraocular movements intact.     Right eye: Normal extraocular motion.     Left eye: Normal extraocular motion and no nystagmus.     Conjunctiva/sclera: Conjunctivae normal.     Right eye: Right conjunctiva is not injected. No chemosis, exudate or hemorrhage.    Left eye: Left conjunctiva is not injected. No chemosis, exudate or hemorrhage.    Comments: Photophobia noted on exam.  Cardiovascular:     Rate and Rhythm: Normal rate.  Pulmonary:     Effort: Pulmonary effort is normal.  Musculoskeletal:     Cervical back: Normal range of motion and neck supple.  Lymphadenopathy:     Cervical: No cervical adenopathy.  Skin:    General: Skin is warm and dry.  Neurological:     General: No focal deficit present.     Mental Status: She is alert and oriented to person, place, and time.     Cranial Nerves: No cranial nerve deficit.     Motor: No weakness.     Coordination: Coordination normal.     Gait: Gait normal.     Deep Tendon Reflexes: Reflexes normal.  Psychiatric:        Mood and Affect: Mood normal.        Behavior: Behavior normal.     Assessment and Plan :   PDMP not reviewed this encounter.  1. Blurred vision, bilateral    Patient is in need of a full ophthalmologic evaluation.  Recommended further evaluation with the ophthalmologist on-call through [facility].  Provided her with information to Dr. Mason Sole.  Patient will follow-up with him urgently.   Adolph Hoop, PA-C 01/04/24 1350

## 2024-01-04 NOTE — ED Triage Notes (Signed)
 Pt c/o blurred vision x2 days.Used visine relief drops.

## 2024-04-15 ENCOUNTER — Ambulatory Visit: Admitting: Nurse Practitioner

## 2024-05-15 ENCOUNTER — Encounter: Payer: Self-pay | Admitting: Nurse Practitioner

## 2024-06-06 ENCOUNTER — Other Ambulatory Visit: Payer: Self-pay

## 2024-06-06 MED ORDER — WEGOVY 0.25 MG/0.5ML ~~LOC~~ SOAJ: 0.2500 mg | SUBCUTANEOUS | 1 refills | Status: AC

## 2024-06-10 ENCOUNTER — Telehealth: Payer: Self-pay

## 2024-06-10 ENCOUNTER — Other Ambulatory Visit (HOSPITAL_COMMUNITY): Payer: Self-pay

## 2024-06-10 NOTE — Telephone Encounter (Signed)
 Hello,                   Our office has received a Prior Authorization request for Wegovy . However the plan doesn't cover the Rx for Weight loss.

## 2024-07-12 ENCOUNTER — Other Ambulatory Visit: Payer: Self-pay | Admitting: Gastroenterology

## 2024-07-12 DIAGNOSIS — K760 Fatty (change of) liver, not elsewhere classified: Secondary | ICD-10-CM

## 2024-07-12 DIAGNOSIS — R7989 Other specified abnormal findings of blood chemistry: Secondary | ICD-10-CM

## 2024-07-30 DIAGNOSIS — K760 Fatty (change of) liver, not elsewhere classified: Secondary | ICD-10-CM | POA: Diagnosis not present

## 2024-08-06 ENCOUNTER — Other Ambulatory Visit: Payer: Self-pay | Admitting: Nurse Practitioner

## 2024-08-06 DIAGNOSIS — F339 Major depressive disorder, recurrent, unspecified: Secondary | ICD-10-CM

## 2024-08-06 NOTE — Telephone Encounter (Signed)
 Last appt. 11/06/23.

## 2024-08-12 ENCOUNTER — Ambulatory Visit: Payer: Self-pay

## 2024-08-12 ENCOUNTER — Ambulatory Visit: Admitting: Family Medicine

## 2024-08-12 VITALS — BP 124/82 | HR 67 | Wt 251.0 lb

## 2024-08-12 DIAGNOSIS — Z8719 Personal history of other diseases of the digestive system: Secondary | ICD-10-CM

## 2024-08-12 DIAGNOSIS — L739 Follicular disorder, unspecified: Secondary | ICD-10-CM

## 2024-08-12 DIAGNOSIS — E8881 Metabolic syndrome: Secondary | ICD-10-CM

## 2024-08-12 MED ORDER — CEPHALEXIN 500 MG PO CAPS: 500.0000 mg | ORAL_CAPSULE | Freq: Two times a day (BID) | ORAL | 0 refills | Status: AC

## 2024-08-12 NOTE — Progress Notes (Signed)
   Name: Rebecca Strickland   Date of Visit: 08/12/24   Date of last visit with me: Visit date not found   CHIEF COMPLAINT:  Chief Complaint  Patient presents with   Acute Visit    Abdominal pain at belly button, lump on incision, had surgery in march.        HPI:  Discussed the use of AI scribe software for clinical note transcription with the patient, who gave verbal consent to proceed.  History of Present Illness   Rebecca Strickland is a 33 year old female who presents with an infection at the site of a previous appendicitis surgery.  She underwent appendicitis surgery in March and recently developed a new lesion at the surgical site. The lesion appeared on Friday and has been progressively worsening, becoming increasingly tender. It is located near a previous scar from a rejected belly button piercing. She accidentally hit the lesion while washing dishes, which caused pain.  She has no known allergies to antibiotics and is currently taking Wegovy , which she resumed on Friday. She notes a significant difference in effectiveness the day after administration and has no issues with the injection process, finding it painless.         OBJECTIVE:       11/06/2023    9:02 AM  Depression screen PHQ 2/9  Decreased Interest 2  Down, Depressed, Hopeless 2  PHQ - 2 Score 4  Altered sleeping 3  Tired, decreased energy 3  Change in appetite 3  Feeling bad or failure about yourself  2  Trouble concentrating 0  Moving slowly or fidgety/restless 0  Suicidal thoughts 0  PHQ-9 Score 15   Difficult doing work/chores Somewhat difficult     Data saved with a previous flowsheet row definition     BP Readings from Last 3 Encounters:  08/12/24 124/82  01/04/24 115/80  11/06/23 122/80    BP 124/82   Pulse 67   Wt 251 lb (113.9 kg)   SpO2 98%   BMI 42.42 kg/m    Physical Exam   ABDOMEN: Tenderness on palpation near surgical site. Surgical site healed well. Noted erythematous base  around follicular area. Mild sign of infection.     Physical Exam  ASSESSMENT/PLAN:   Assessment & Plan Folliculitis  H/O appendicitis    Assessment and Plan    Folliculitis of abdominal wall scar Folliculitis at previous appendicitis surgery scar, likely due to scar tissue and past piercing. Unlikely pseudomonas infection. - Prescribed keflex  antibiotics twice daily for 7 days. - Sent prescription to pharmacy. - Consider changing if no improvement, no concerns for broader infection or MRSA at this time.   Obesity treated with Wegovy  Obesity managed with Wegovy , effective with no adverse interactions. Self-pay due to lack of insurance coverage. - Continue Wegovy  as prescribed.         Marcayla Budge A. Vita MD [facility]

## 2024-08-12 NOTE — Telephone Encounter (Signed)
 FYI Only or Action Required?: Action required by provider: request for appointment.  Patient was last seen in primary care on 11/06/2023 by Early, Camie BRAVO, NP.  Called Nurse Triage reporting Abdominal Pain.  Symptoms began several days ago.  Interventions attempted: Nothing.  Symptoms are: gradually worsening.Has a small bulge at belly button, red, tender.   Triage Disposition: See HCP Within 4 Hours (Or PCP Triage)  Patient/caregiver understands and will follow disposition?: Yes       Copied from CRM #[id number]. Topic: Clinical - Red Word Triage >> Aug 12, 2024  8:56 AM [place] MATSU wrote: Red Word that prompted transfer to Nurse Triage:bludge by belly button . Per patient she had appendectomy 12/02/23 but now has redness and painful sees white dots. Reason for Disposition  [1] MILD-MODERATE pain AND [2] constant AND [3] present > 2 hours  Answer Assessment - Initial Assessment Questions 1. LOCATION: Where does it hurt?      Bulge at belly button 2. RADIATION: Does the pain shoot anywhere else? (e.g., chest, back)     no 3. ONSET: When did the pain begin? (e.g., minutes, hours or days ago)      Last Friday 4. SUDDEN: Gradual or sudden onset?     sudden 5. PATTERN Does the pain come and go, or is it constant?     Comes and goes 6. SEVERITY: How bad is the pain?  (e.g., Scale 1-10; mild, moderate, or severe)     4 7. RECURRENT SYMPTOM: Have you ever had this type of stomach pain before? If Yes, ask: When was the last time? and What happened that time?      no 8. CAUSE: What do you think is causing the stomach pain? (e.g., gallstones, recent abdominal surgery)     infection 9. RELIEVING/AGGRAVATING FACTORS: What makes it better or worse? (e.g., antacids, bending or twisting motion, bowel movement)     no 10. OTHER SYMPTOMS: Do you have any other symptoms? (e.g., back pain, diarrhea, fever, urination pain, vomiting)       red 11. PREGNANCY: Is there any  chance you are pregnant? When was your last menstrual period?       no  Protocols used: Abdominal Pain - [facility]

## 2024-08-30 ENCOUNTER — Encounter: Payer: Self-pay | Admitting: Nurse Practitioner

## 2024-08-30 ENCOUNTER — Other Ambulatory Visit: Payer: Self-pay | Admitting: Nurse Practitioner

## 2024-08-30 ENCOUNTER — Other Ambulatory Visit: Payer: Self-pay

## 2024-08-30 MED ORDER — WEGOVY 0.5 MG/0.5ML ~~LOC~~ SOAJ: 0.5000 mg | SUBCUTANEOUS | 1 refills | Status: AC

## 2024-09-02 DIAGNOSIS — K760 Fatty (change of) liver, not elsewhere classified: Secondary | ICD-10-CM | POA: Diagnosis not present

## 2024-09-16 ENCOUNTER — Other Ambulatory Visit

## 2024-10-01 ENCOUNTER — Encounter: Payer: Self-pay | Admitting: Nurse Practitioner

## 2024-10-02 ENCOUNTER — Ambulatory Visit
Admission: RE | Admit: 2024-10-02 | Discharge: 2024-10-02 | Disposition: A | Discharge status: Home / Self Care | Source: Ambulatory Visit | Attending: Gastroenterology | Admitting: Gastroenterology

## 2024-10-02 DIAGNOSIS — K760 Fatty (change of) liver, not elsewhere classified: Secondary | ICD-10-CM

## 2024-10-02 DIAGNOSIS — R7989 Other specified abnormal findings of blood chemistry: Secondary | ICD-10-CM

## 2024-10-02 MED ORDER — GADOPICLENOL 0.5 MMOL/ML IV SOLN
10.0000 mL | Freq: Once | INTRAVENOUS | Status: AC | PRN
  Administered 2024-10-02: 10 mL via INTRAVENOUS

## 2024-11-07 ENCOUNTER — Encounter: Payer: BC Managed Care – PPO | Admitting: Nurse Practitioner
